# Patient Record
Sex: Male | Born: 1995 | Hispanic: Yes | Smoking: Never smoker
Health system: Southern US, Community
[De-identification: ages and names within clinical notes are randomized; demographics above are authoritative.]

## PROBLEM LIST (undated history)

## (undated) DIAGNOSIS — K7460 Unspecified cirrhosis of liver: Secondary | ICD-10-CM

---
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
## 2014-12-18 DIAGNOSIS — S064XAA Epidural hemorrhage with loss of consciousness status unknown, initial encounter: Secondary | ICD-10-CM | POA: Insufficient documentation

## 2015-12-29 DIAGNOSIS — F1994 Other psychoactive substance use, unspecified with psychoactive substance-induced mood disorder: Secondary | ICD-10-CM | POA: Insufficient documentation

## 2016-05-10 ENCOUNTER — Encounter: Payer: Self-pay | Admitting: *Deleted

## 2016-05-10 ENCOUNTER — Ambulatory Visit
Admission: EM | Admit: 2016-05-10 | Discharge: 2016-05-10 | Disposition: A | Discharge status: Home / Self Care | Payer: Self-pay | Attending: Family Medicine | Admitting: Family Medicine

## 2016-05-10 DIAGNOSIS — L02412 Cutaneous abscess of left axilla: Secondary | ICD-10-CM

## 2016-05-10 MED ORDER — OXYCODONE-ACETAMINOPHEN 5-325 MG PO TABS: 1.0000 | ORAL_TABLET | Freq: Three times a day (TID) | ORAL | 0 refills | Status: DC | PRN

## 2016-05-10 MED ORDER — SULFAMETHOXAZOLE-TRIMETHOPRIM 800-160 MG PO TABS: 1.0000 | ORAL_TABLET | Freq: Two times a day (BID) | ORAL | 0 refills | Status: DC

## 2016-05-10 NOTE — ED Provider Notes (Signed)
[facility]    CSN: [id number] Arrival date & time: 05/10/16  1005     History   Chief Complaint Chief Complaint  Patient presents with  . Abscess    HPI William Beasley is a 20 y.o. male.   Patient reports having pimple underneath his left arm to 3 days maximum he states that started on Tuesday evening. Since then has gotten much bigger and very painful. Past smoking history he denies any medical problems no previous surgeries no known drug allergies. He denies smoking. No pertinent or significant family medical history pertaining to today's visit.    The history is provided by the patient. No language interpreter was used.  Abscess  Location:  Shoulder/arm Shoulder/arm abscess location:  L axilla Abscess quality: fluctuance, painful, redness and warmth   Red streaking: yes   Duration:  4 days Progression:  Worsening Pain details:    Quality:  Pressure and aching   Severity:  Severe   Timing:  Constant   Progression:  Worsening Chronicity:  New Context: not diabetes, not immunosuppression, not injected drug use, not insect bite/sting and not skin injury   Relieved by:  Nothing Ineffective treatments:  None tried Associated symptoms: no fatigue, no fever, no nausea and no vomiting   Risk factors: no family hx of MRSA, no hx of MRSA and no prior abscess     History reviewed. No pertinent past medical history.  There are no active problems to display for this patient.   History reviewed. No pertinent surgical history.     Home Medications    Prior to Admission medications   Medication Sig Start Date End Date Taking? Authorizing Provider  oxyCODONE-acetaminophen (PERCOCET/ROXICET) 5-325 MG tablet Take 1 tablet by mouth every 8 (eight) hours as needed for severe pain. 05/10/16   Hassan Rowan, MD  sulfamethoxazole-trimethoprim (BACTRIM DS,SEPTRA DS) 800-160 MG tablet Take 1 tablet by mouth 2 (two) times daily. 05/10/16   Hassan Rowan, MD    Family  History History reviewed. No pertinent family history.  Social History Social History  Substance Use Topics  . Smoking status: Never Smoker  . Smokeless tobacco: Never Used  . Alcohol use No     Allergies   Review of patient's allergies indicates no known allergies.   Review of Systems Review of Systems  Constitutional: Negative for fatigue and fever.  Gastrointestinal: Negative for nausea and vomiting.  Skin: Positive for rash.  All other systems reviewed and are negative.    Physical Exam Triage Vital Signs ED Triage Vitals  Enc Vitals Group     BP 05/10/16 1039 136/68     Pulse Rate 05/10/16 1039 94     Resp 05/10/16 1039 16     Temp 05/10/16 1039 98.5 F (36.9 C)     Temp Source 05/10/16 1039 Oral     SpO2 05/10/16 1039 100 %     Weight 05/10/16 1040 225 lb (102.1 kg)     Height 05/10/16 1040 5\' 7"  (1.702 m)     Head Circumference --      Peak Flow --      Pain Score 05/10/16 1044 10     Pain Loc --      Pain Edu? --      Excl. in GC? --    No data found.   Updated Vital Signs BP 136/68 (BP Location: Right Arm)   Pulse 94   Temp 98.5 F (36.9 C) (Oral)   Resp 16  Ht 5\' 7"  (1.702 m)   Wt 225 lb (102.1 kg)   SpO2 100%   BMI 35.24 kg/m   Visual Acuity Right Eye Distance:   Left Eye Distance:   Bilateral Distance:    Right Eye Near:   Left Eye Near:    Bilateral Near:     Physical Exam  Constitutional: He is oriented to person, place, and time. He appears well-developed and well-nourished.  HENT:  Head: Normocephalic and atraumatic.  Eyes: Pupils are equal, round, and reactive to light.  Neck: Normal range of motion. Neck supple.  Pulmonary/Chest: Effort normal.  Musculoskeletal: Normal range of motion.  Neurological: He is alert and oriented to person, place, and time.  Skin: There is erythema.     Large abscess underneath the left axillary area skin spread quite a bit with stretching of the skin making stretch marks very prominent  due to the stretching in the dilatation of the skin in the axillary area.  Psychiatric: He has a normal mood and affect.  Vitals reviewed.    UC Treatments / Results  Labs (all labs ordered are listed, but only abnormal results are displayed) Labs Reviewed  AEROBIC CULTURE (SUPERFICIAL SPECIMEN)    EKG  EKG Interpretation None       Radiology No results found.  Procedures .Marland KitchenIncision and Drainage Date/Time: 05/10/2016 11:58 AM Performed by: Hassan Rowan Authorized by: Hassan Rowan   Consent:    Consent obtained:  Verbal   Consent given by:  Patient Location:    Type:  Abscess   Location:  Upper extremity   Upper extremity location:  Shoulder   Shoulder location:  L shoulder Pre-procedure details:    Skin preparation:  Betadine Anesthesia (see MAR for exact dosages):    Anesthesia method:  Local infiltration   Local anesthetic:  Lidocaine 1% w/o epi Procedure type:    Complexity:  Simple Procedure details:    Incision types:  Stab incision and single straight   Incision depth:  Dermal   Scalpel blade:  11   Wound management:  Probed and deloculated and irrigated with saline   Drainage:  Purulent   Drainage amount:  Copious   Wound treatment:  Wound left open   Packing materials:  1/2 in gauze   Amount 1/2":  Enttire pack Post-procedure details:    Patient tolerance of procedure:  Tolerated with difficulty Comments:     Culture obtained packing done irritated about 50 mL's of saline into the abscess wound   (including critical care time)  Medications Ordered in UC Medications - No data to display   Initial Impression / Assessment and Plan / UC Course  I have reviewed the triage vital signs and the nursing notes.  Pertinent labs & imaging results that were available during my care of the patient were reviewed by me and considered in my medical decision making (see chart for details).  Clinical Course    Patient tolerated the opening of the abscess  packing. We'll place on Percocet No. 12 given patient was checked at the [facility] drug reporting site and found to be present on the site over the last year. Place on Septra DS 1 tablet twice a day as well work note for Friday Saturday and Sunday with instructions to return on Sunday for removal of packing and possible repacking that time..  Final Clinical Impressions(s) / UC Diagnoses   Final diagnoses:  Abscess of left axilla    New Prescriptions New Prescriptions  OXYCODONE-ACETAMINOPHEN (PERCOCET/ROXICET) 5-325 MG TABLET    Take 1 tablet by mouth every 8 (eight) hours as needed for severe pain.   SULFAMETHOXAZOLE-TRIMETHOPRIM (BACTRIM DS,SEPTRA DS) 800-160 MG TABLET    Take 1 tablet by mouth 2 (two) times daily.     Hassan RowanEugene Shalen Petrak, MD 05/10/16 1201

## 2016-05-10 NOTE — ED Triage Notes (Signed)
Patient reports a abscess starting 2 days ago in his left armpit. No previous history of cysts or abscess.

## 2016-05-12 ENCOUNTER — Ambulatory Visit
Admission: EM | Admit: 2016-05-12 | Discharge: 2016-05-12 | Disposition: A | Discharge status: Home / Self Care | Payer: Self-pay | Attending: Family Medicine | Admitting: Family Medicine

## 2016-05-12 DIAGNOSIS — L02412 Cutaneous abscess of left axilla: Secondary | ICD-10-CM

## 2016-05-12 LAB — AEROBIC CULTURE W GRAM STAIN (SUPERFICIAL SPECIMEN)

## 2016-05-12 LAB — AEROBIC CULTURE  (SUPERFICIAL SPECIMEN)

## 2016-05-12 MED ORDER — OXYCODONE-ACETAMINOPHEN 5-325 MG PO TABS: 1.0000 | ORAL_TABLET | Freq: Three times a day (TID) | ORAL | 0 refills | Status: DC | PRN

## 2016-05-12 MED ORDER — MUPIROCIN 2 % EX OINT: 1.0000 "application " | TOPICAL_OINTMENT | Freq: Three times a day (TID) | CUTANEOUS | 0 refills | Status: DC

## 2016-05-12 MED ORDER — CLINDAMYCIN HCL 300 MG PO CAPS: 300.0000 mg | ORAL_CAPSULE | Freq: Three times a day (TID) | ORAL | 0 refills | Status: DC

## 2016-05-12 NOTE — ED Triage Notes (Signed)
Follow up for abscess

## 2016-05-12 NOTE — ED Provider Notes (Signed)
[facility]    CSN: [id number] Arrival date & time: 05/12/16  1301     History   Chief Complaint Chief Complaint  Patient presents with  . Abscess    HPI William Beasley is a 20 y.o. male.   Please see previous note. Patient is here for follow-up of abscess. He reports increased pain and increased redness despite having I&D. Abscess did grow out MRSA sensitive to the Septra. He states he's been using 2 Percocet to take care of the pain.   The history is provided by the patient and a significant other. No language interpreter was used.  Abscess  Location:  Shoulder/arm Shoulder/arm abscess location:  L axilla Abscess quality: induration, itching, painful and redness   Abscess quality: no fluctuance   Progression:  Unable to specify Pain details:    Quality:  Throbbing, pressure, sharp and shooting   Severity:  Moderate   Timing:  Constant Chronicity:  New   History reviewed. No pertinent past medical history.  There are no active problems to display for this patient.   History reviewed. No pertinent surgical history.     Home Medications    Prior to Admission medications   Medication Sig Start Date End Date Taking? Authorizing Provider  sulfamethoxazole-trimethoprim (BACTRIM DS,SEPTRA DS) 800-160 MG tablet Take 1 tablet by mouth 2 (two) times daily. 05/10/16  Yes Hassan Rowan, MD  clindamycin (CLEOCIN) 300 MG capsule Take 1 capsule (300 mg total) by mouth 3 (three) times daily. 05/12/16   Hassan Rowan, MD  mupirocin ointment (BACTROBAN) 2 % Apply 1 application topically 3 (three) times daily. 05/12/16   Hassan Rowan, MD  oxyCODONE-acetaminophen (PERCOCET/ROXICET) 5-325 MG tablet Take 1 tablet by mouth every 8 (eight) hours as needed for severe pain. 05/12/16   Hassan Rowan, MD    Family History History reviewed. No pertinent family history.  Social History Social History  Substance Use Topics  . Smoking status: Never Smoker  . Smokeless tobacco: Never  Used  . Alcohol use No     Allergies   Review of patient's allergies indicates no known allergies.   Review of Systems Review of Systems  All other systems reviewed and are negative.    Physical Exam Triage Vital Signs ED Triage Vitals  Enc Vitals Group     BP 05/12/16 1309 (!) 146/75     Pulse Rate 05/12/16 1309 97     Resp 05/12/16 1309 18     Temp 05/12/16 1309 98 F (36.7 C)     Temp Source 05/12/16 1309 Oral     SpO2 05/12/16 1309 97 %     Weight 05/12/16 1308 225 lb (102.1 kg)     Height 05/12/16 1308 5\' 7"  (1.702 m)     Head Circumference --      Peak Flow --      Pain Score 05/12/16 1308 8     Pain Loc --      Pain Edu? --      Excl. in GC? --    No data found.   Updated Vital Signs BP (!) 146/75 (BP Location: Right Arm)   Pulse 97   Temp 98 F (36.7 C) (Oral)   Resp 18   Ht 5\' 7"  (1.702 m)   Wt 225 lb (102.1 kg)   SpO2 97%   BMI 35.24 kg/m   Visual Acuity Right Eye Distance:   Left Eye Distance:   Bilateral Distance:    Right Eye Near:  Left Eye Near:    Bilateral Near:     Physical Exam  Constitutional: He is oriented to person, place, and time. He appears well-developed and well-nourished.  HENT:  Head: Normocephalic.  Neck: Neck supple.  Musculoskeletal: Normal range of motion.  Neurological: He is alert and oriented to person, place, and time. No cranial nerve deficit.  Skin: Rash noted. There is erythema.  Psychiatric: He has a normal mood and affect.  Vitals reviewed.    UC Treatments / Results  Labs (all labs ordered are listed, but only abnormal results are displayed) Labs Reviewed - No data to display  EKG  EKG Interpretation None       Radiology No results found.  Procedures Irrigation Date/Time: 05/12/2016 1:51 PM Performed by: Hassan RowanWADE, Nazia Rhines Authorized by: Hassan RowanWADE, Laranda Burkemper  Consent: Verbal consent obtained. Local anesthesia used: no  Anesthesia: Local anesthesia used: no  Sedation: Patient sedated:  no Comments: Patient had his packing removed because of the abscess going inferior to the open site with ID was over 100 mL's of saline was irrigated into the incision site. Initially of some mild pus was extruded from the site with manipulation and squeezing. Because the amount of pus still present though incision site was also irrigated.    (including critical care time)  Medications Ordered in UC Medications - No data to display   Initial Impression / Assessment and Plan / UC Course  I have reviewed the triage vital signs and the nursing notes.  Pertinent labs & imaging results that were available during my care of the patient were reviewed by me and considered in my medical decision making (see chart for details).  Clinical Course    Explained to patient and his his SO that she really needs to try to have this incision site draining. I recommend reverse Trendelenburg by putting pillows under his hips putting a pad under his left axillary and allowing fluid to drain from the abscess. If he is not better in the next 48 hours if he starts running a fever or the redness gets worse recommend going to the ED of the choice for further evaluation and possible drain to be placed and IV antibiotics . Otherwise we will place him on Cleocin 300 mg 3 times a day with the Septra DS and Bactroban ointment. Will renew his Percocet actually give him more #15 and follow-up on Tuesday.  Final Clinical Impressions(s) / UC Diagnoses   Final diagnoses:  Abscess of left axilla    New Prescriptions New Prescriptions   CLINDAMYCIN (CLEOCIN) 300 MG CAPSULE    Take 1 capsule (300 mg total) by mouth 3 (three) times daily.   MUPIROCIN OINTMENT (BACTROBAN) 2 %    Apply 1 application topically 3 (three) times daily.     Hassan RowanEugene Sherleen Pangborn, MD 05/12/16 [phone]

## 2016-05-14 ENCOUNTER — Ambulatory Visit
Admission: EM | Admit: 2016-05-14 | Discharge: 2016-05-14 | Disposition: A | Discharge status: Home / Self Care | Payer: Self-pay | Attending: Family Medicine | Admitting: Family Medicine

## 2016-05-14 ENCOUNTER — Encounter: Payer: Self-pay | Admitting: *Deleted

## 2016-05-14 DIAGNOSIS — Z5189 Encounter for other specified aftercare: Secondary | ICD-10-CM

## 2016-05-14 MED ORDER — OXYCODONE-ACETAMINOPHEN 7.5-325 MG PO TABS: 1.0000 | ORAL_TABLET | Freq: Three times a day (TID) | ORAL | 0 refills | Status: DC | PRN

## 2016-05-14 NOTE — ED Triage Notes (Signed)
Pt seen here Saturday for left axilla abscess. Here today for wound check. Site remains painful and draining.

## 2016-05-14 NOTE — ED Provider Notes (Signed)
[facility]    CSN: [id number] Arrival date & time: 05/14/16  1106     History   Chief Complaint Chief Complaint  Patient presents with  . Wound Check    HPI William MeigsBrian J Beasley is a 20 y.o. male.   Patient's here for recheck of wound he finally has submitted the wound does feel better. The redness and inflammation has gone down his only concern is that when he takes his pain medicine he still taking 2 Percocet tablets that time was noted to have a higher dosage. States that the 5 mg by themselves not strong enough. He is taking both antibiotics both Septra and clindamycin antibiotic ointment to the area. No known drug allergies no previous surgical history no pertinent medical history no pertinent family medical history and patient does not smoke.   The history is provided by the patient. No language interpreter was used.  Wound Check  This is a new problem. The problem has been gradually improving. Pertinent negatives include no chest pain, no abdominal pain, no headaches and no shortness of breath. The symptoms are aggravated by bending. Relieved by: narcotic  He has tried nothing for the symptoms. The treatment provided no relief.    History reviewed. No pertinent past medical history.  There are no active problems to display for this patient.   History reviewed. No pertinent surgical history.     Home Medications    Prior to Admission medications   Medication Sig Start Date End Date Taking? Authorizing Provider  clindamycin (CLEOCIN) 300 MG capsule Take 1 capsule (300 mg total) by mouth 3 (three) times daily. 05/12/16  Yes Hassan RowanEugene Greenleigh Kauth, MD  sulfamethoxazole-trimethoprim (BACTRIM DS,SEPTRA DS) 800-160 MG tablet Take 1 tablet by mouth 2 (two) times daily. 05/10/16  Yes Hassan RowanEugene Rayleigh Gillyard, MD  mupirocin ointment (BACTROBAN) 2 % Apply 1 application topically 3 (three) times daily. 05/12/16   Hassan RowanEugene Arlind Klingerman, MD  oxyCODONE-acetaminophen (PERCOCET) 7.5-325 MG tablet Take 1 tablet  by mouth every 8 (eight) hours as needed for moderate pain or severe pain. 05/14/16   Hassan RowanEugene Derryl Uher, MD    Family History History reviewed. No pertinent family history.  Social History Social History  Substance Use Topics  . Smoking status: Never Smoker  . Smokeless tobacco: Never Used  . Alcohol use No     Allergies   Review of patient's allergies indicates no known allergies.   Review of Systems Review of Systems  Constitutional: Negative.   HENT: Negative.   Respiratory: Negative.  Negative for shortness of breath.   Cardiovascular: Negative for chest pain.  Gastrointestinal: Negative for abdominal pain.  Skin: Positive for rash and wound.  Neurological: Negative for headaches.  Hematological: Negative for adenopathy. Does not bruise/bleed easily.  All other systems reviewed and are negative.    Physical Exam Triage Vital Signs ED Triage Vitals  Enc Vitals Group     BP 05/14/16 1121 139/66     Pulse Rate 05/14/16 1121 85     Resp 05/14/16 1121 16     Temp 05/14/16 1121 98.1 F (36.7 C)     Temp Source 05/14/16 1121 Oral     SpO2 05/14/16 1121 99 %     Weight 05/14/16 1124 205 lb (93 kg)     Height 05/14/16 1124 5\' 9"  (1.753 m)     Head Circumference --      Peak Flow --      Pain Score --      Pain Loc --  Pain Edu? --      Excl. in GC? --    No data found.   Updated Vital Signs BP 139/66 (BP Location: Left Arm)   Pulse 85   Temp 98.1 F (36.7 C) (Oral)   Resp 16   Ht 5\' 9"  (1.753 m)   Wt 205 lb (93 kg)   SpO2 99%   BMI 30.27 kg/m   Visual Acuity Right Eye Distance:   Left Eye Distance:   Bilateral Distance:    Right Eye Near:   Left Eye Near:    Bilateral Near:     Physical Exam  Constitutional: He is oriented to person, place, and time. He appears well-developed and well-nourished.  HENT:  Head: Normocephalic and atraumatic.  Eyes: Pupils are equal, round, and reactive to light.  Neck: Normal range of motion. Neck supple.    Pulmonary/Chest: Effort normal.  Musculoskeletal: Normal range of motion.  Neurological: He is alert and oriented to person, place, and time.  Skin: Skin is warm. Ecchymosis noted. There is erythema.     The wound on the right axillary area looks markedly improved. Using some gentle pressure some more pus was extruded from the wound site but the wound is open. The marked redness and inflammation he had inferior to the wound is much improved.  Psychiatric: He has a normal mood and affect.  Vitals reviewed.    UC Treatments / Results  Labs (all labs ordered are listed, but only abnormal results are displayed) Labs Reviewed - No data to display  EKG  EKG Interpretation None       Radiology No results found.  Procedures Procedures (including critical care time)  Medications Ordered in UC Medications - No data to display   Initial Impression / Assessment and Plan / UC Course  I have reviewed the triage vital signs and the nursing notes.  Pertinent labs & imaging results that were available during my care of the patient were reviewed by me and considered in my medical decision making (see chart for details).  Clinical Course   The patient has emphasize that is not a fan of procedures with this wound. I reassured him that I'm not going to irrigate the wound or repack it at this time. He may return in a week if needed he is to continue both antibiotics of Septra and clindamycin and Bactroban ointment. I am going to let him return back to work tomorrow we need to keep the area covered. He has informed me that the pain medication is not controlling the pain really takes one he has take 2 tablets 3 wants to know if he had a stronger dosage. This, not clearly sure that he's under need much more pain medication but I will change his Percocet 5 mg to 7 a half give him 12 and every 8 hours but I do not think the patient needs any more medicine for pain at this time.   Final Clinical  Impressions(s) / UC Diagnoses   Final diagnoses:  Wound check, abscess  Abscess re-check    New Prescriptions New Prescriptions   OXYCODONE-ACETAMINOPHEN (PERCOCET) 7.5-325 MG TABLET    Take 1 tablet by mouth every 8 (eight) hours as needed for moderate pain or severe pain.     Hassan Rowan, MD 05/14/16 1254

## 2018-07-04 ENCOUNTER — Other Ambulatory Visit: Payer: Self-pay

## 2018-07-04 ENCOUNTER — Emergency Department
Admission: EM | Admit: 2018-07-04 | Discharge: 2018-07-04 | Disposition: A | Discharge status: Home / Self Care | Payer: Self-pay | Attending: Student in an Organized Health Care Education/Training Program | Admitting: Student in an Organized Health Care Education/Training Program

## 2018-07-04 ENCOUNTER — Encounter: Payer: Self-pay | Admitting: Emergency Medicine

## 2018-07-04 DIAGNOSIS — Y929 Unspecified place or not applicable: Secondary | ICD-10-CM | POA: Insufficient documentation

## 2018-07-04 DIAGNOSIS — R040 Epistaxis: Secondary | ICD-10-CM | POA: Insufficient documentation

## 2018-07-04 DIAGNOSIS — Y9384 Activity, sleeping: Secondary | ICD-10-CM | POA: Insufficient documentation

## 2018-07-04 DIAGNOSIS — K920 Hematemesis: Secondary | ICD-10-CM | POA: Insufficient documentation

## 2018-07-04 DIAGNOSIS — Y998 Other external cause status: Secondary | ICD-10-CM | POA: Insufficient documentation

## 2018-07-04 DIAGNOSIS — K2921 Alcoholic gastritis with bleeding: Secondary | ICD-10-CM | POA: Insufficient documentation

## 2018-07-04 DIAGNOSIS — S0033XA Contusion of nose, initial encounter: Secondary | ICD-10-CM | POA: Insufficient documentation

## 2018-07-04 DIAGNOSIS — W500XXA Accidental hit or strike by another person, initial encounter: Secondary | ICD-10-CM | POA: Insufficient documentation

## 2018-07-04 LAB — CBC
HEMATOCRIT: 47.4 % (ref 39.0–52.0)
Hemoglobin: 16.2 g/dL (ref 13.0–17.0)
MCH: 32.9 pg (ref 26.0–34.0)
MCHC: 34.2 g/dL (ref 30.0–36.0)
MCV: 96.1 fL (ref 80.0–100.0)
PLATELETS: 151 10*3/uL (ref 150–400)
RBC: 4.93 MIL/uL (ref 4.22–5.81)
RDW: 11.9 % (ref 11.5–15.5)
WBC: 5.8 10*3/uL (ref 4.0–10.5)
nRBC: 0 % (ref 0.0–0.2)

## 2018-07-04 LAB — COMPREHENSIVE METABOLIC PANEL
ALT: 60 U/L — ABNORMAL HIGH (ref 0–44)
AST: 69 U/L — ABNORMAL HIGH (ref 15–41)
Albumin: 4.2 g/dL (ref 3.5–5.0)
Alkaline Phosphatase: 71 U/L (ref 38–126)
Anion gap: 9 (ref 5–15)
BUN: 6 mg/dL (ref 6–20)
CHLORIDE: 101 mmol/L (ref 98–111)
CO2: 28 mmol/L (ref 22–32)
Calcium: 9.6 mg/dL (ref 8.9–10.3)
Creatinine, Ser: 0.54 mg/dL — ABNORMAL LOW (ref 0.61–1.24)
Glucose, Bld: 106 mg/dL — ABNORMAL HIGH (ref 70–99)
POTASSIUM: 4.4 mmol/L (ref 3.5–5.1)
SODIUM: 138 mmol/L (ref 135–145)
Total Bilirubin: 1.4 mg/dL — ABNORMAL HIGH (ref 0.3–1.2)
Total Protein: 8.4 g/dL — ABNORMAL HIGH (ref 6.5–8.1)

## 2018-07-04 LAB — TYPE AND SCREEN
ABO/RH(D): A POS
ANTIBODY SCREEN: NEGATIVE

## 2018-07-04 MED ORDER — FAMOTIDINE 20 MG PO TABS: 20.0000 mg | ORAL_TABLET | Freq: Two times a day (BID) | ORAL | 1 refills | Status: DC

## 2018-07-04 MED ORDER — PROCHLORPERAZINE MALEATE 10 MG PO TABS
10.0000 mg | ORAL_TABLET | Freq: Once | ORAL | Status: DC
Start: 2018-07-04 — End: 2018-07-04

## 2018-07-04 MED ORDER — PROCHLORPERAZINE MALEATE 10 MG PO TABS: 10.0000 mg | ORAL_TABLET | Freq: Four times a day (QID) | ORAL | 0 refills | Status: DC | PRN

## 2018-07-04 MED ORDER — SUCRALFATE 1 G PO TABS
1.0000 g | ORAL_TABLET | Freq: Once | ORAL | Status: AC
  Administered 2018-07-04: 1 g via ORAL
  Filled 2018-07-04: qty 1

## 2018-07-04 MED ORDER — FLUTICASONE PROPIONATE 50 MCG/ACT NA SUSP: 1.0000 | Freq: Every day | NASAL | 0 refills | Status: DC

## 2018-07-04 NOTE — ED Notes (Signed)
Pt verbalized understanding of discharge instructions. NAD at this time. 

## 2018-07-04 NOTE — ED Notes (Signed)
MD Robinson at bedside 
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
## 2018-07-04 NOTE — ED Notes (Signed)
Pt refused compazine. Did not want to wait for med to come from pharmacy.

## 2018-07-04 NOTE — ED Triage Notes (Signed)
Pt to ED via POV c/o vomiting blood. Pt states that this started this morning. Pt reports that blood is bright red in color. Pt has had this happen before about 3 months ago and [facility] and told that he had an infection. Pt is unsure exactly how much blood he has vomited but thinks it was about 1/2 cup. Pt is in NAD at this time, color is WNL.

## 2019-07-26 ENCOUNTER — Encounter: Payer: Self-pay | Admitting: Emergency Medicine

## 2019-07-26 ENCOUNTER — Emergency Department
Admission: EM | Admit: 2019-07-26 | Discharge: 2019-07-26 | Disposition: A | Discharge status: Home / Self Care | Payer: Self-pay | Attending: Emergency Medicine | Admitting: Emergency Medicine

## 2019-07-26 ENCOUNTER — Other Ambulatory Visit: Payer: Self-pay

## 2019-07-26 DIAGNOSIS — Z79899 Other long term (current) drug therapy: Secondary | ICD-10-CM | POA: Insufficient documentation

## 2019-07-26 DIAGNOSIS — F1010 Alcohol abuse, uncomplicated: Secondary | ICD-10-CM | POA: Insufficient documentation

## 2019-07-26 DIAGNOSIS — R748 Abnormal levels of other serum enzymes: Secondary | ICD-10-CM

## 2019-07-26 DIAGNOSIS — K625 Hemorrhage of anus and rectum: Secondary | ICD-10-CM | POA: Insufficient documentation

## 2019-07-26 DIAGNOSIS — R945 Abnormal results of liver function studies: Secondary | ICD-10-CM | POA: Insufficient documentation

## 2019-07-26 LAB — COMPREHENSIVE METABOLIC PANEL
ALT: 199 U/L — ABNORMAL HIGH (ref 0–44)
AST: 116 U/L — ABNORMAL HIGH (ref 15–41)
Albumin: 4.8 g/dL (ref 3.5–5.0)
Alkaline Phosphatase: 100 U/L (ref 38–126)
Anion gap: 13 (ref 5–15)
BUN: 6 mg/dL (ref 6–20)
CO2: 23 mmol/L (ref 22–32)
Calcium: 9.5 mg/dL (ref 8.9–10.3)
Chloride: 100 mmol/L (ref 98–111)
Creatinine, Ser: 0.61 mg/dL (ref 0.61–1.24)
GFR calc Af Amer: >60 mL/min (ref >60)
GFR calc non Af Amer: >60 mL/min (ref >60)
Glucose, Bld: 129 mg/dL — ABNORMAL HIGH (ref 70–99)
Potassium: 3.9 mmol/L (ref 3.5–5.1)
Sodium: 136 mmol/L (ref 135–145)
Total Bilirubin: 1 mg/dL (ref 0.3–1.2)
Total Protein: 9.5 g/dL — ABNORMAL HIGH (ref 6.5–8.1)

## 2019-07-26 LAB — CBC
HCT: 49.2 % (ref 39.0–52.0)
Hemoglobin: 16.9 g/dL (ref 13.0–17.0)
MCH: 32.8 pg (ref 26.0–34.0)
MCHC: 34.3 g/dL (ref 30.0–36.0)
MCV: 95.3 fL (ref 80.0–100.0)
Platelets: 208 10*3/uL (ref 150–400)
RBC: 5.16 MIL/uL (ref 4.22–5.81)
RDW: 11.7 % (ref 11.5–15.5)
WBC: 9.4 10*3/uL (ref 4.0–10.5)
nRBC: 0 % (ref 0.0–0.2)

## 2019-07-26 MED ORDER — HYDROCORTISONE ACETATE 25 MG RE SUPP: 25.0000 mg | Freq: Two times a day (BID) | RECTAL | 1 refills | Status: AC

## 2019-07-26 NOTE — ED Triage Notes (Signed)
Patient presents to the ED with bright red bleeding with stools.  Patient states this occurred multiple times today and was a significant amount of blood.  Patient states, "I drink a lot".  Patient states he drinks approx. 1-2 40oz beers per day.  Patient denies any medical history.

## 2019-07-26 NOTE — ED Provider Notes (Signed)
[facility] Emergency Department Provider Note   ____________________________________________   I have reviewed the triage vital signs and the nursing notes.   HISTORY  Chief Complaint Rectal Bleeding   History limited by: Not Limited   HPI William Beasley is a 23 y.o. male who presents to the emergency department today with concerns for rectal bleeding.  The patient states that he noticed some bright red blood per rectum earlier today when he had a bowel movement.  He then had 2 further episodes of rectal bleeding.  He has had this happen once before roughly 6 months ago.  He has not had any rectal pain except for when he wipes.  Patient does state that he drinks a lot of alcohol.  He denies any shortness of breath, dizziness or weakness.   Records reviewed. Per medical record review patient has a history of gastritis.  History reviewed. No pertinent past medical history.  There are no problems to display for this patient.   History reviewed. No pertinent surgical history.  Prior to Admission medications   Medication Sig Start Date End Date Taking? Authorizing Provider  clindamycin (CLEOCIN) 300 MG capsule Take 1 capsule (300 mg total) by mouth 3 (three) times daily. 05/12/16   Hassan Rowan, MD  famotidine (PEPCID) 20 MG tablet Take 1 tablet (20 mg total) by mouth 2 (two) times daily. 07/04/18 07/04/19  Willy Eddy, MD  fluticasone (FLONASE) 50 MCG/ACT nasal spray Place 1 spray into both nostrils daily. 07/04/18 07/04/19  Willy Eddy, MD  mupirocin ointment (BACTROBAN) 2 % Apply 1 application topically 3 (three) times daily. 05/12/16   Hassan Rowan, MD  oxyCODONE-acetaminophen (PERCOCET) 7.5-325 MG tablet Take 1 tablet by mouth every 8 (eight) hours as needed for moderate pain or severe pain. 05/14/16   Hassan Rowan, MD  prochlorperazine (COMPAZINE) 10 MG tablet Take 1 tablet (10 mg total) by mouth every 6 (six) hours as needed for nausea or  vomiting. 07/04/18   Willy Eddy, MD  sulfamethoxazole-trimethoprim (BACTRIM DS,SEPTRA DS) 800-160 MG tablet Take 1 tablet by mouth 2 (two) times daily. 05/10/16   Hassan Rowan, MD    Allergies Patient has no known allergies.  No family history on file.  Social History Social History   Tobacco Use  . Smoking status: Never Smoker  . Smokeless tobacco: Never Used  Substance Use Topics  . Alcohol use: Yes    Comment: 12-18 beer Friday and Saturday   . Drug use: No    Review of Systems Constitutional: No fever/chills Eyes: No visual changes. ENT: No sore throat. Cardiovascular: Denies chest pain. Respiratory: Denies shortness of breath. Gastrointestinal: Positive for rectal bleeding Genitourinary: Negative for dysuria. Musculoskeletal: Negative for back pain. Skin: Negative for rash. Neurological: Negative for headaches, focal weakness or numbness.  ____________________________________________   PHYSICAL EXAM:  VITAL SIGNS: ED Triage Vitals  Enc Vitals Group     BP 07/26/19 1956 (!) 167/100     Pulse Rate 07/26/19 1956 (!) 115     Resp 07/26/19 1956 18     Temp 07/26/19 1956 99.4 F (37.4 C)     Temp Source 07/26/19 1956 Oral     SpO2 07/26/19 1956 97 %     Weight 07/26/19 1957 250 lb (113.4 kg)     Height 07/26/19 1957 5\' 8"  (1.727 m)     Head Circumference --      Peak Flow --      Pain Score 07/26/19 1957 0   Constitutional:  Alert and oriented.  Eyes: Conjunctivae are normal.  ENT      Head: Normocephalic and atraumatic.      Nose: No congestion/rhinnorhea.      Mouth/Throat: Mucous membranes are moist.      Neck: No stridor. Hematological/Lymphatic/Immunilogical: No cervical lymphadenopathy. Cardiovascular: Normal rate, regular rhythm.  No murmurs, rubs, or gallops.  Respiratory: Normal respiratory effort without tachypnea nor retractions. Breath sounds are clear and equal bilaterally. No wheezes/rales/rhonchi. Gastrointestinal: Soft and non  tender. No rebound. No guarding. No external hemorrhoids appreciated. Musculoskeletal: Normal range of motion in all extremities. No lower extremity edema. Neurologic:  Normal speech and language. No gross focal neurologic deficits are appreciated.  Skin:  Skin is warm, dry and intact. No rash noted. Psychiatric: Mood and affect are normal. Speech and behavior are normal. Patient exhibits appropriate insight and judgment.  ____________________________________________    LABS (pertinent positives/negatives)  CBC wbc 9.4, hgb 16.9, plt 208 CMP wnl except glu 129, cr 0.61, ast 116, alt 199  ____________________________________________   EKG  None  ____________________________________________    RADIOLOGY  None  ____________________________________________   PROCEDURES  Procedures  ____________________________________________   INITIAL IMPRESSION / ASSESSMENT AND PLAN / ED COURSE  Pertinent labs & imaging results that were available during my care of the patient were reviewed by me and considered in my medical decision making (see chart for details).   Patient presented to the emergency department today because of concern for rectal bleeding. No obvious external hemorrhoids. Patient declined digital rectal exam. hgb within normal limits. Patient admitted to heavy alcohol use which would explain elevated liver enzymes. Discussed this with the patient, discussed importance of alcohol cessation. Will treat for presumptive hemorrhoids. Discussed gi follow up.  ____________________________________________   FINAL CLINICAL IMPRESSION(S) / ED DIAGNOSES  Final diagnoses:  Rectal bleeding  Elevated liver enzymes  Alcohol abuse     Note: This dictation was prepared with Dragon dictation. Any transcriptional errors that result from this process are unintentional     Nance Pear, MD 07/26/19 [phone]

## 2019-07-26 NOTE — Discharge Instructions (Addendum)
Please seek medical attention for any high fevers, chest pain, shortness of breath, change in behavior, persistent vomiting, bloody stool or any other new or concerning symptoms.  

## 2019-07-26 NOTE — ED Notes (Signed)
EDP at bedside  

## 2021-08-22 DIAGNOSIS — I8511 Secondary esophageal varices with bleeding: Secondary | ICD-10-CM | POA: Insufficient documentation

## 2021-08-22 DIAGNOSIS — K7030 Alcoholic cirrhosis of liver without ascites: Secondary | ICD-10-CM | POA: Insufficient documentation

## 2022-01-18 ENCOUNTER — Other Ambulatory Visit: Payer: Self-pay

## 2022-01-18 ENCOUNTER — Emergency Department: Payer: BLUE CROSS/BLUE SHIELD

## 2022-01-18 ENCOUNTER — Encounter: Payer: Self-pay | Admitting: Intensive Care

## 2022-01-18 ENCOUNTER — Emergency Department
Admission: EM | Admit: 2022-01-18 | Discharge: 2022-01-18 | Disposition: A | Discharge status: Home / Self Care | Payer: BLUE CROSS/BLUE SHIELD | Attending: Emergency Medicine | Admitting: Emergency Medicine

## 2022-01-18 DIAGNOSIS — R112 Nausea with vomiting, unspecified: Secondary | ICD-10-CM | POA: Diagnosis present

## 2022-01-18 DIAGNOSIS — E876 Hypokalemia: Secondary | ICD-10-CM | POA: Diagnosis not present

## 2022-01-18 DIAGNOSIS — R1013 Epigastric pain: Secondary | ICD-10-CM | POA: Insufficient documentation

## 2022-01-18 DIAGNOSIS — K7460 Unspecified cirrhosis of liver: Secondary | ICD-10-CM

## 2022-01-18 DIAGNOSIS — I10 Essential (primary) hypertension: Secondary | ICD-10-CM | POA: Diagnosis not present

## 2022-01-18 DIAGNOSIS — K7030 Alcoholic cirrhosis of liver without ascites: Secondary | ICD-10-CM | POA: Diagnosis not present

## 2022-01-18 HISTORY — DX: Unspecified cirrhosis of liver: K74.60

## 2022-01-18 LAB — CBC
HCT: 45.5 % (ref 39.0–52.0)
Hemoglobin: 15.6 g/dL (ref 13.0–17.0)
MCH: 31.2 pg (ref 26.0–34.0)
MCHC: 34.3 g/dL (ref 30.0–36.0)
MCV: 91 fL (ref 80.0–100.0)
Platelets: 91 10*3/uL — ABNORMAL LOW (ref 150–400)
RBC: 5 MIL/uL (ref 4.22–5.81)
RDW: 13.8 % (ref 11.5–15.5)
WBC: 7.1 10*3/uL (ref 4.0–10.5)
nRBC: 0 % (ref 0.0–0.2)

## 2022-01-18 LAB — COMPREHENSIVE METABOLIC PANEL
ALT: 37 U/L (ref 0–44)
AST: 59 U/L — ABNORMAL HIGH (ref 15–41)
Albumin: 4.1 g/dL (ref 3.5–5.0)
Alkaline Phosphatase: 96 U/L (ref 38–126)
Anion gap: 12 (ref 5–15)
BUN: <5 mg/dL — ABNORMAL LOW (ref 6–20)
CO2: 22 mmol/L (ref 22–32)
Calcium: 9.1 mg/dL (ref 8.9–10.3)
Chloride: 105 mmol/L (ref 98–111)
Creatinine, Ser: 0.51 mg/dL — ABNORMAL LOW (ref 0.61–1.24)
GFR, Estimated: >60 mL/min (ref >60)
Glucose, Bld: 113 mg/dL — ABNORMAL HIGH (ref 70–99)
Potassium: 3.3 mmol/L — ABNORMAL LOW (ref 3.5–5.1)
Sodium: 139 mmol/L (ref 135–145)
Total Bilirubin: 1.3 mg/dL — ABNORMAL HIGH (ref 0.3–1.2)
Total Protein: 8.7 g/dL — ABNORMAL HIGH (ref 6.5–8.1)

## 2022-01-18 LAB — PROTIME-INR
INR: 1.3 — ABNORMAL HIGH (ref 0.8–1.2)
Prothrombin Time: 15.8 seconds — ABNORMAL HIGH (ref 11.4–15.2)

## 2022-01-18 LAB — LIPASE, BLOOD: Lipase: 40 U/L (ref 11–51)

## 2022-01-18 LAB — MAGNESIUM: Magnesium: 1.6 mg/dL — ABNORMAL LOW (ref 1.7–2.4)

## 2022-01-18 MED ORDER — PANTOPRAZOLE INFUSION (NEW) - SIMPLE MED
8.0000 mg/h | INTRAVENOUS | Status: DC
  Administered 2022-01-18: 8 mg/h via INTRAVENOUS
  Filled 2022-01-18: qty 100

## 2022-01-18 MED ORDER — IOHEXOL 300 MG/ML  SOLN
100.0000 mL | Freq: Once | INTRAMUSCULAR | Status: AC | PRN
  Administered 2022-01-18: 100 mL via INTRAVENOUS

## 2022-01-18 MED ORDER — FENTANYL CITRATE PF 50 MCG/ML IJ SOSY
50.0000 ug | PREFILLED_SYRINGE | Freq: Once | INTRAMUSCULAR | Status: AC
  Administered 2022-01-18: 50 ug via INTRAVENOUS
  Filled 2022-01-18: qty 1

## 2022-01-18 MED ORDER — ONDANSETRON 4 MG PO TBDP: 4.0000 mg | ORAL_TABLET | Freq: Three times a day (TID) | ORAL | 0 refills | Status: AC | PRN

## 2022-01-18 MED ORDER — ONDANSETRON HCL 4 MG/2ML IJ SOLN
4.0000 mg | Freq: Once | INTRAMUSCULAR | Status: AC | PRN
  Administered 2022-01-18: 4 mg via INTRAVENOUS
  Filled 2022-01-18: qty 2

## 2022-01-18 MED ORDER — POTASSIUM CHLORIDE CRYS ER 20 MEQ PO TBCR
40.0000 meq | EXTENDED_RELEASE_TABLET | Freq: Once | ORAL | Status: AC
  Administered 2022-01-18: 40 meq via ORAL
  Filled 2022-01-18: qty 2

## 2022-01-18 MED ORDER — DIPHENHYDRAMINE HCL 50 MG/ML IJ SOLN
50.0000 mg | Freq: Once | INTRAMUSCULAR | Status: AC
  Administered 2022-01-18: 50 mg via INTRAVENOUS
  Filled 2022-01-18: qty 1

## 2022-01-18 MED ORDER — SODIUM CHLORIDE 0.9 % IV SOLN
1.0000 g | Freq: Once | INTRAVENOUS | Status: AC
  Administered 2022-01-18: 1 g via INTRAVENOUS
  Filled 2022-01-18: qty 10

## 2022-01-18 MED ORDER — PROCHLORPERAZINE EDISYLATE 10 MG/2ML IJ SOLN
10.0000 mg | Freq: Once | INTRAMUSCULAR | Status: AC
  Administered 2022-01-18: 10 mg via INTRAVENOUS
  Filled 2022-01-18: qty 2

## 2022-01-18 MED ORDER — POTASSIUM CHLORIDE 10 MEQ/100ML IV SOLN
10.0000 meq | Freq: Once | INTRAVENOUS | Status: AC
  Administered 2022-01-18: 10 meq via INTRAVENOUS
  Filled 2022-01-18: qty 100

## 2022-01-18 MED ORDER — ONDANSETRON HCL 4 MG/2ML IJ SOLN
4.0000 mg | Freq: Once | INTRAMUSCULAR | Status: AC
Start: 2022-01-18 — End: 2022-01-18
  Administered 2022-01-18: 4 mg via INTRAVENOUS
  Filled 2022-01-18: qty 2

## 2022-01-18 MED ORDER — PANTOPRAZOLE 80MG IVPB - SIMPLE MED
80.0000 mg | Freq: Once | INTRAVENOUS | Status: AC
  Administered 2022-01-18: 80 mg via INTRAVENOUS
  Filled 2022-01-18: qty 100

## 2022-01-18 MED ORDER — MAGNESIUM SULFATE 2 GM/50ML IV SOLN
2.0000 g | Freq: Once | INTRAVENOUS | Status: AC
  Administered 2022-01-18: 2 g via INTRAVENOUS
  Filled 2022-01-18: qty 50

## 2022-01-18 MED ORDER — MAGNESIUM OXIDE -MG SUPPLEMENT 400 (240 MG) MG PO TABS: 800.0000 mg | ORAL_TABLET | ORAL | Status: DC

## 2022-01-18 MED ORDER — PANTOPRAZOLE SODIUM 40 MG PO TBEC: 40.0000 mg | DELAYED_RELEASE_TABLET | Freq: Every day | ORAL | 0 refills | Status: DC

## 2022-01-18 MED ORDER — DROPERIDOL 2.5 MG/ML IJ SOLN
1.2500 mg | Freq: Once | INTRAMUSCULAR | Status: AC
  Administered 2022-01-18: 1.25 mg via INTRAVENOUS
  Filled 2022-01-18: qty 2

## 2022-01-18 MED ORDER — PANTOPRAZOLE SODIUM 40 MG IV SOLR: 40.0000 mg | Freq: Two times a day (BID) | INTRAVENOUS | Status: DC

## 2022-01-18 NOTE — ED Provider Notes (Signed)
[facility] Provider Note    Event Date/Time   First MD Initiated Contact with Patient 01/18/22 1307     (approximate)   History   Abdominal Pain and Emesis   HPI  William Beasley is a 26 y.o. 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  He denies any melanotic stools or diarrhea or constipation.  No back pain, chest pain, cough, shortness of breath, headache area, sore throat rash or extremity pain.  He states that last night he had 6 nonalcoholic [employer]'s but otherwise does not drink any alcohol denies any illicit drug use or NSAID use.      Physical Exam  Triage Vital Signs: ED Triage Vitals [01/18/22 1255]  Enc Vitals Group     BP      Pulse      Resp      Temp      Temp src      SpO2      Weight 240 lb (108.9 kg)     Height _0  (1.753 m)     Head Circumference      Peak Flow      Pain Score 10     Pain Loc      Pain Edu?      Excl. in [place]?     Most recent vital signs: Vitals:   01/18/22 1318  BP: 132/78  Pulse: 88  Resp: 20  Temp: 98 F (36.7 C)  SpO2: 99%    General: Awake, very uncomfortable appearing. CV:  Good peripheral perfusion.  2+ radial pulses. Resp:  Normal effort.  Clear bilaterally. Abd:  Distended and tender in epigastrium and right upper quadrant.  No CVA tenderness. Other:     ED Results / Procedures / Treatments  Labs (all labs ordered are listed, but only abnormal results are displayed) Labs Reviewed  COMPREHENSIVE METABOLIC PANEL - Abnormal; Notable for the following components:      Result Value   Potassium 3.3 (*)    Glucose, Bld 113 (*)    BUN <5 (*)     Creatinine, Ser 0.51 (*)    Total Protein 8.7 (*)    AST 59 (*)    Total Bilirubin 1.3 (*)    All other components within normal limits  CBC - Abnormal; Notable for the following components:   Platelets 91 (*)    All other components within normal limits  MAGNESIUM - Abnormal; Notable for the following components:   Magnesium 1.6 (*)    All other components within normal limits  PROTIME-INR - Abnormal; Notable for the following components:   Prothrombin Time 15.8 (*)    INR 1.3 (*)    All other components within normal limits  LIPASE, BLOOD  URINALYSIS, ROUTINE W REFLEX MICROSCOPIC  TYPE AND SCREEN     EKG    RADIOLOGY  Right upper quadrant ultrasound my interpretation shows what appears to be some steatosis although I am unable to clearly visualize the gallbladder.  I did also review radiology's interpretation.  CT abdomen pelvis on my interpretation does show what appears to be cirrhosis without evidence of cholecystitis, dilated common  bile duct, pancreatitis, contained perforation, kidney stone or any other acute abdominal or pelvic process.  I also reviewed radiology interpretation and agree to findings of cirrhosis with evidence of portal hypertension and several subcentimeter hypodensities with recommendation for MRI liver protocol follow-up as well as hepatosplenomegaly and some increased echogenicity in the right hilum with recommendation to follow this up with a CT.   PROCEDURES:  Critical Care performed: No  .1-3 Lead EKG Interpretation  Performed by: Lucrezia Starch, MD Authorized by: Lucrezia Starch, MD     Interpretation: normal     ECG rate assessment: normal     Rhythm: sinus rhythm     Ectopy: none     Conduction: normal     The patient is on the cardiac monitor to evaluate for evidence of arrhythmia and/or significant heart rate changes.   MEDICATIONS ORDERED IN ED: Medications  pantoprozole (PROTONIX) 80 mg /NS 100 mL infusion (8 mg/hr  Intravenous New Bag/Given 01/18/22 1508)  pantoprazole (PROTONIX) injection 40 mg (has no administration in time range)  ondansetron (ZOFRAN) injection 4 mg (4 mg Intravenous Given 01/18/22 1300)  fentaNYL (SUBLIMAZE) injection 50 mcg (50 mcg Intravenous Given 01/18/22 1338)  pantoprazole (PROTONIX) 80 mg /NS 100 mL IVPB (0 mg Intravenous Stopped 01/18/22 1440)  cefTRIAXone (ROCEPHIN) 1 g in sodium chloride 0.9 % 100 mL IVPB (0 g Intravenous Stopped 01/18/22 1435)  magnesium sulfate IVPB 2 g 50 mL (2 g Intravenous New Bag/Given 01/18/22 1514)  potassium chloride 10 mEq in 100 mL IVPB (0 mEq Intravenous Stopped 01/18/22 1506)  ondansetron (ZOFRAN) injection 4 mg (4 mg Intravenous Given 01/18/22 1456)  prochlorperazine (COMPAZINE) injection 10 mg (10 mg Intravenous Given 01/18/22 1542)  fentaNYL (SUBLIMAZE) injection 50 mcg (50 mcg Intravenous Given 01/18/22 1543)  iohexol (OMNIPAQUE) 300 MG/ML solution 100 mL (100 mLs Intravenous Contrast Given 01/18/22 1524)     IMPRESSION / MDM / ASSESSMENT AND PLAN / ED COURSE  I reviewed the triage vital signs and the nursing notes. Patient's presentation is most consistent with acute presentation with potential threat to life or bodily function.                               Differential diagnosis includes, but is not limited to cholecystitis, exacerbation of underlying hepatitis, gastritis, peptic ulcer disease, pancreatitis and possible esophageal bleed and Mallory-Weiss tear from vomiting.  Lipase WNL and not suggestive of pancreatitis.  CMP is remarkable for K3.3, AST of 59 and T. bili of 1.3.  Alk phos and ALT are WNL.  CBC without leukocytosis or acute anemia.  Magnesium 1.6.  INR 1.3.  Right upper quadrant ultrasound my interpretation shows what appears to be some steatosis although I am unable to clearly visualize the gallbladder.  I did also review radiology's interpretation.  CT abdomen pelvis on my interpretation does show what appears to be cirrhosis  without evidence of cholecystitis, dilated common bile duct, pancreatitis, contained perforation, kidney stone or any other acute abdominal or pelvic process.  I also reviewed radiology interpretation and agree to findings of cirrhosis with evidence of portal hypertension and several subcentimeter hypodensities with recommendation for MRI liver protocol follow-up as well as hepatosplenomegaly and some increased echogenicity in the right hilum with recommendation to follow this up with a CT.  Patient's history and exam and work-up is not suggestive of perforation, cholecystitis, pancreatitis and he has not had any vomiting after 3 and half  hours in the emergency room.  After Protonix Bentyl and Zofran Compazine he is feeling drastically much improved.  He is able to tolerate ginger ale without any difficulty and states he feels much better and wants to go home.  Discussed importance of close outpatient follow-up with his GI doctor as well as his PCP to have his electrolytes rechecked.  We will start him on Protonix and write Rx for Zofran.  Discussed incidental findings on imaging as noted above and recommendation for outpatient chest CT as well as liver MRI protocol to be coordinated by his GI doctor and PCP as indicated.  Discussed returning for any new or worsening of symptoms.  Discharged in stable condition.  Strict return precautions advised and discussed.      FINAL CLINICAL IMPRESSION(S) / ED DIAGNOSES   Final diagnoses:  Nausea and vomiting, unspecified vomiting type  Epigastric pain  Hypomagnesemia  Hypokalemia  Cirrhosis of liver without ascites, unspecified hepatic cirrhosis type ([place])     Rx / DC Orders   ED Discharge Orders          Ordered    pantoprazole (PROTONIX) 40 MG tablet  Daily        01/18/22 1623    ondansetron (ZOFRAN-ODT) 4 MG disintegrating tablet  Every 8 hours PRN        01/18/22 1623             Note:  This document was prepared using Dragon voice  recognition software and may include unintentional dictation errors.   Lucrezia Starch, MD 01/18/22 1630

## 2022-01-18 NOTE — Discharge Instructions (Addendum)
As we discussed please follow-up with your GI doctor and your primary care doctor for further imaging including CT of the chest to follow-up the enlarged lymph nodes and MRI of the abdomen to follow-up some abnormal findings in your liver.  CT A/P  IMPRESSION: 1. Cirrhosis with portal hypertension. Underlying innumerable hepatic subcentimeter hypodensities. Recommend MRI liver protocol for further evaluation. 2. Hepatosplenomegaly. 3. Query partially visualized right hilar soft tissue density. Consider CT chest with intravenous contrast for further evaluation.

## 2022-01-18 NOTE — ED Triage Notes (Signed)
Patient c/o abdominal pain with N/V since this AM. Reports hx cirrhosis of liver. Patient reports drinking 6 non alcoholic beers last night

## 2022-01-22 ENCOUNTER — Ambulatory Visit
Admission: EM | Admit: 2022-01-22 | Discharge: 2022-01-22 | Disposition: A | Discharge status: Home / Self Care | Payer: BLUE CROSS/BLUE SHIELD

## 2022-01-22 DIAGNOSIS — K7460 Unspecified cirrhosis of liver: Secondary | ICD-10-CM | POA: Diagnosis not present

## 2022-01-22 MED ORDER — THIAMINE MONONITRATE 100 MG PO TABS: 100.0000 mg | ORAL_TABLET | Freq: Every day | ORAL | 1 refills | Status: DC

## 2022-01-22 MED ORDER — FOLIC ACID 1 MG PO TABS: 1.0000 mg | ORAL_TABLET | Freq: Every day | ORAL | 1 refills | Status: DC

## 2022-01-22 MED ORDER — CARVEDILOL 6.25 MG PO TABS: 6.2500 mg | ORAL_TABLET | Freq: Two times a day (BID) | ORAL | 1 refills | Status: DC

## 2022-01-22 MED ORDER — TRAMADOL HCL 50 MG PO TABS: 50.0000 mg | ORAL_TABLET | Freq: Four times a day (QID) | ORAL | 0 refills | Status: DC | PRN

## 2022-01-22 NOTE — ED Provider Notes (Signed)
[facility]    CSN: [id number] Arrival date & time: 01/22/22  1433      History   Chief Complaint Chief Complaint  Patient presents with   Medication Refill    HPI William Beasley is a 26 y.o. male.   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  Patient being followed by liver program at [facility] and is scheduled for a biopsy on the 28th.  Patient presenting needing refills of his thiamine, Coreg and also requesting medication for pain.  Patient states that the GI doctor at [facility] is want him to follow-up with primary care but his closest primary care availability is in September.    Past Medical History:  Diagnosis Date   Cirrhosis of liver (HCC)     There are no problems to display for this patient.   History reviewed. No pertinent surgical history.     Home Medications    Prior to Admission medications   Medication Sig Start Date End Date Taking? Authorizing Provider  Multiple Vitamins-Minerals (THERA-M) TABS Take 1 tablet by mouth daily. 08/22/21  Yes [provider]  traMADol (ULTRAM) 50 MG tablet Take 1 tablet (50 mg total) by mouth every 6 (six) hours as needed. 01/22/22  Yes Candis Schatz, PA-C  carvedilol (COREG) 6.25 MG tablet Take 1 tablet (6.25 mg total) by mouth 2 (two) times daily. 01/22/22   Candis Schatz, PA-C  fluticasone (FLONASE) 50 MCG/ACT nasal spray Place 1 spray into both nostrils daily. 07/04/18 07/04/19  Willy Eddy, MD  folic acid (FOLVITE) 1 MG tablet Take 1 tablet (1 mg total) by mouth daily. 01/22/22   Candis Schatz, PA-C  mupirocin ointment (BACTROBAN) 2 % Apply 1 application topically 3 (three) times daily. 05/12/16   Hassan Rowan, MD  ondansetron (ZOFRAN-ODT) 4 MG disintegrating tablet Take 1 tablet (4 mg total) by mouth every 8 (eight) hours as needed for up to 5 days for nausea or vomiting. 01/18/22 01/23/22  Gilles Chiquito, MD   pantoprazole (PROTONIX) 40 MG tablet Take 1 tablet (40 mg total) by mouth daily. 01/18/22 02/17/22  Gilles Chiquito, MD  thiamine (VITAMIN B-1) 100 MG tablet Take 1 tablet (100 mg total) by mouth daily. 01/22/22   Candis Schatz, PA-C    Family History History reviewed. No pertinent family history.  Social History Social History   Tobacco Use   Smoking status: Never   Smokeless tobacco: Never  Vaping Use   Vaping Use: Never used  Substance Use Topics   Alcohol use: Not Currently    Comment: 12-18 beer Friday and Saturday    Drug use: No     Allergies   Patient has no known allergies.   Review of Systems Review of Systems as noted above in HPI.  Other systems reviewed and found to be negative.   Physical Exam Triage Vital Signs ED Triage Vitals  Enc Vitals Group     BP 01/22/22 1558 (S) (!) 146/112     Pulse Rate 01/22/22 1558 80     Resp 01/22/22 1558 16     Temp 01/22/22 1558 98.1 F (36.7 C)     Temp Source 01/22/22 1558 Oral     SpO2 01/22/22 1558 99 %     Weight --      Height --      Head Circumference --      Peak Flow --  Pain Score 01/22/22 1554 7     Pain Loc --      Pain Edu? --      Excl. in GC? --    No data found.  Updated Vital Signs BP (S) (!) 146/112 (BP Location: Left Arm) Comment: not on his meds.  Pulse 80   Temp 98.1 F (36.7 C) (Oral)   Resp 16   SpO2 99%    Physical Exam Constitutional:      Appearance: Normal appearance.  Pulmonary:     Effort: Pulmonary effort is normal. No respiratory distress.     Breath sounds: No wheezing.  Abdominal:     General: Abdomen is flat.     Palpations: Abdomen is soft.     Tenderness: There is abdominal tenderness (RUQ). There is no rebound.  Skin:    General: Skin is warm and dry.  Neurological:     General: No focal deficit present.     Mental Status: He is alert and oriented to person, place, and time.      UC Treatments / Results  Labs (all labs ordered are listed, but  only abnormal results are displayed) Labs Reviewed - No data to display  EKG   Radiology No results found.  Procedures Procedures (including critical care time)  Medications Ordered in UC Medications - No data to display  Initial Impression / Assessment and Plan / UC Course  I have reviewed the triage vital signs and the nursing notes.  Pertinent labs & imaging results that were available during my care of the patient were reviewed by me and considered in my medical decision making (see chart for details).    Patient with history of cirrhosis and imaging showing advancement to include portal hypertension and esophageal varices.  Patient with biopsy plan for 628.  Followed by [facility] hepatology  Final Clinical Impressions(s) / UC Diagnoses   Final diagnoses:  Hepatic cirrhosis, unspecified hepatic cirrhosis type, unspecified whether ascites present [facility])     Discharge Instructions      -Continue Coreg, folic acid, and thiamine -Tramadol as needed for pain -Keep June 28 biopsy appointment.  Can request again with Dr. Clelia Croft for him to continue these meds since you are able to see a primary provider or see if his office can contact Dr. Theresia Majors office for an earlier appointment given your complicated medical status and need for ongoing management for both liver and lung disease.     ED Prescriptions     Medication Sig Dispense Auth. Provider   folic acid (FOLVITE) 1 MG tablet Take 1 tablet (1 mg total) by mouth daily. 30 tablet Candis Schatz, PA-C   carvedilol (COREG) 6.25 MG tablet Take 1 tablet (6.25 mg total) by mouth 2 (two) times daily. 60 tablet Candis Schatz, PA-C   thiamine (VITAMIN B-1) 100 MG tablet Take 1 tablet (100 mg total) by mouth daily. 30 tablet Candis Schatz, PA-C   traMADol (ULTRAM) 50 MG tablet Take 1 tablet (50 mg total) by mouth every 6 (six) hours as needed. 15 tablet Candis Schatz, PA-C      I have reviewed the PDMP during this  encounter.     Candis Schatz, PA-C 01/22/22 1620

## 2022-01-22 NOTE — ED Triage Notes (Signed)
Patient presents to Urgent Care for medication refill. He states he was seen at [facility] ED 02/10 and dx with cirrhosis. He was prescribed carvedilol and thiamine as part of his treatment plan and instructed to follow-up with PCP. He states he never followed up and out of his meds.

## 2022-01-22 NOTE — Discharge Instructions (Addendum)
-  Continue Coreg, folic acid, and thiamine -Tramadol as needed for pain -Keep June 28 biopsy appointment.  Can request again with Dr. Clelia Croft for him to continue these meds since you are able to see a primary provider or see if his office can contact Dr. Theresia Majors office for an earlier appointment given your complicated medical status and need for ongoing management for both liver and lung disease.

## 2022-03-12 ENCOUNTER — Inpatient Hospital Stay: Payer: BLUE CROSS/BLUE SHIELD

## 2022-03-12 ENCOUNTER — Other Ambulatory Visit: Payer: Self-pay

## 2022-03-12 ENCOUNTER — Inpatient Hospital Stay
Admission: EM | Admit: 2022-03-12 | Discharge: 2022-03-14 | DRG: 378 | Disposition: A | Discharge status: Home / Self Care | Payer: BLUE CROSS/BLUE SHIELD | Attending: Internal Medicine | Admitting: Internal Medicine

## 2022-03-12 ENCOUNTER — Emergency Department: Payer: BLUE CROSS/BLUE SHIELD

## 2022-03-12 DIAGNOSIS — D860 Sarcoidosis of lung: Secondary | ICD-10-CM | POA: Diagnosis present

## 2022-03-12 DIAGNOSIS — K922 Gastrointestinal hemorrhage, unspecified: Principal | ICD-10-CM | POA: Diagnosis present

## 2022-03-12 DIAGNOSIS — K7030 Alcoholic cirrhosis of liver without ascites: Secondary | ICD-10-CM | POA: Diagnosis present

## 2022-03-12 DIAGNOSIS — F1010 Alcohol abuse, uncomplicated: Secondary | ICD-10-CM | POA: Diagnosis present

## 2022-03-12 DIAGNOSIS — T405X1A Poisoning by cocaine, accidental (unintentional), initial encounter: Secondary | ICD-10-CM | POA: Diagnosis present

## 2022-03-12 DIAGNOSIS — X58XXXA Exposure to other specified factors, initial encounter: Secondary | ICD-10-CM | POA: Diagnosis present

## 2022-03-12 DIAGNOSIS — D8689 Sarcoidosis of other sites: Secondary | ICD-10-CM | POA: Diagnosis present

## 2022-03-12 DIAGNOSIS — K7460 Unspecified cirrhosis of liver: Secondary | ICD-10-CM

## 2022-03-12 DIAGNOSIS — F1290 Cannabis use, unspecified, uncomplicated: Secondary | ICD-10-CM | POA: Diagnosis present

## 2022-03-12 DIAGNOSIS — Z79899 Other long term (current) drug therapy: Secondary | ICD-10-CM | POA: Diagnosis not present

## 2022-03-12 DIAGNOSIS — K2921 Alcoholic gastritis with bleeding: Principal | ICD-10-CM | POA: Diagnosis present

## 2022-03-12 DIAGNOSIS — Z87820 Personal history of traumatic brain injury: Secondary | ICD-10-CM | POA: Diagnosis not present

## 2022-03-12 DIAGNOSIS — D6959 Other secondary thrombocytopenia: Secondary | ICD-10-CM | POA: Diagnosis present

## 2022-03-12 DIAGNOSIS — K920 Hematemesis: Secondary | ICD-10-CM | POA: Diagnosis not present

## 2022-03-12 DIAGNOSIS — I8510 Secondary esophageal varices without bleeding: Secondary | ICD-10-CM | POA: Diagnosis present

## 2022-03-12 DIAGNOSIS — K760 Fatty (change of) liver, not elsewhere classified: Secondary | ICD-10-CM | POA: Diagnosis present

## 2022-03-12 DIAGNOSIS — K766 Portal hypertension: Secondary | ICD-10-CM | POA: Diagnosis present

## 2022-03-12 DIAGNOSIS — T405X1D Poisoning by cocaine, accidental (unintentional), subsequent encounter: Secondary | ICD-10-CM | POA: Diagnosis not present

## 2022-03-12 DIAGNOSIS — F1490 Cocaine use, unspecified, uncomplicated: Secondary | ICD-10-CM | POA: Diagnosis present

## 2022-03-12 LAB — URINE DRUG SCREEN, QUALITATIVE (ARMC ONLY)
Amphetamines, Ur Screen: NOT DETECTED
Barbiturates, Ur Screen: NOT DETECTED
Benzodiazepine, Ur Scrn: NOT DETECTED
Cannabinoid 50 Ng, Ur ~~LOC~~: POSITIVE — AB
Cocaine Metabolite,Ur ~~LOC~~: POSITIVE — AB
MDMA (Ecstasy)Ur Screen: NOT DETECTED
Methadone Scn, Ur: NOT DETECTED
Opiate, Ur Screen: NOT DETECTED
Phencyclidine (PCP) Ur S: NOT DETECTED
Tricyclic, Ur Screen: NOT DETECTED

## 2022-03-12 LAB — URINALYSIS, ROUTINE W REFLEX MICROSCOPIC
Bilirubin Urine: NEGATIVE
Glucose, UA: NEGATIVE mg/dL
Hgb urine dipstick: NEGATIVE
Ketones, ur: NEGATIVE mg/dL
Nitrite: NEGATIVE
Protein, ur: 100 mg/dL — AB
Specific Gravity, Urine: 1.021 (ref 1.005–1.030)
pH: 8 (ref 5.0–8.0)

## 2022-03-12 LAB — COMPREHENSIVE METABOLIC PANEL
ALT: 43 U/L (ref 0–44)
AST: 74 U/L — ABNORMAL HIGH (ref 15–41)
Albumin: 4.2 g/dL (ref 3.5–5.0)
Alkaline Phosphatase: 118 U/L (ref 38–126)
Anion gap: 12 (ref 5–15)
BUN: <5 mg/dL — ABNORMAL LOW (ref 6–20)
CO2: 22 mmol/L (ref 22–32)
Calcium: 9.2 mg/dL (ref 8.9–10.3)
Chloride: 101 mmol/L (ref 98–111)
Creatinine, Ser: 0.57 mg/dL — ABNORMAL LOW (ref 0.61–1.24)
GFR, Estimated: >60 mL/min (ref >60)
Glucose, Bld: 140 mg/dL — ABNORMAL HIGH (ref 70–99)
Potassium: 3.5 mmol/L (ref 3.5–5.1)
Sodium: 135 mmol/L (ref 135–145)
Total Bilirubin: 1.7 mg/dL — ABNORMAL HIGH (ref 0.3–1.2)
Total Protein: 8.7 g/dL — ABNORMAL HIGH (ref 6.5–8.1)

## 2022-03-12 LAB — CBC
HCT: 45.8 % (ref 39.0–52.0)
Hemoglobin: 15.6 g/dL (ref 13.0–17.0)
MCH: 32 pg (ref 26.0–34.0)
MCHC: 34.1 g/dL (ref 30.0–36.0)
MCV: 94 fL (ref 80.0–100.0)
Platelets: 71 10*3/uL — ABNORMAL LOW (ref 150–400)
RBC: 4.87 MIL/uL (ref 4.22–5.81)
RDW: 14.6 % (ref 11.5–15.5)
WBC: 4.3 10*3/uL (ref 4.0–10.5)
nRBC: 0 % (ref 0.0–0.2)

## 2022-03-12 LAB — MRSA NEXT GEN BY PCR, NASAL: MRSA by PCR Next Gen: NOT DETECTED

## 2022-03-12 LAB — TYPE AND SCREEN
ABO/RH(D): A POS
Antibody Screen: NEGATIVE

## 2022-03-12 LAB — GLUCOSE, CAPILLARY: Glucose-Capillary: 161 mg/dL — ABNORMAL HIGH (ref 70–99)

## 2022-03-12 LAB — PROTIME-INR
INR: 1.3 — ABNORMAL HIGH (ref 0.8–1.2)
Prothrombin Time: 15.9 seconds — ABNORMAL HIGH (ref 11.4–15.2)

## 2022-03-12 LAB — LIPASE, BLOOD: Lipase: 39 U/L (ref 11–51)

## 2022-03-12 LAB — APTT: aPTT: 33 seconds (ref 24–36)

## 2022-03-12 LAB — FIBRINOGEN: Fibrinogen: 199 mg/dL — ABNORMAL LOW (ref 210–475)

## 2022-03-12 MED ORDER — PROCHLORPERAZINE EDISYLATE 10 MG/2ML IJ SOLN
10.0000 mg | Freq: Once | INTRAMUSCULAR | Status: AC
  Administered 2022-03-12: 10 mg via INTRAVENOUS
  Filled 2022-03-12: qty 2

## 2022-03-12 MED ORDER — DOCUSATE SODIUM 100 MG PO CAPS: 100.0000 mg | ORAL_CAPSULE | Freq: Two times a day (BID) | ORAL | Status: DC | PRN

## 2022-03-12 MED ORDER — ONDANSETRON 4 MG PO TBDP
4.0000 mg | ORAL_TABLET | Freq: Once | ORAL | Status: AC
  Administered 2022-03-12: 4 mg via ORAL
  Filled 2022-03-12: qty 1

## 2022-03-12 MED ORDER — LORAZEPAM 2 MG/ML IJ SOLN
1.0000 mg | INTRAMUSCULAR | Status: DC | PRN
  Administered 2022-03-12 – 2022-03-13 (×3): 2 mg via INTRAVENOUS
  Filled 2022-03-12 (×3): qty 1

## 2022-03-12 MED ORDER — SODIUM CHLORIDE 0.9 % IV SOLN: 25.0000 mg | Freq: Four times a day (QID) | INTRAVENOUS | Status: DC | PRN

## 2022-03-12 MED ORDER — IOHEXOL 350 MG/ML SOLN
100.0000 mL | Freq: Once | INTRAVENOUS | Status: AC | PRN
  Administered 2022-03-12: 100 mL via INTRAVENOUS

## 2022-03-12 MED ORDER — PANTOPRAZOLE SODIUM 40 MG IV SOLR: 40.0000 mg | Freq: Two times a day (BID) | INTRAVENOUS | Status: DC

## 2022-03-12 MED ORDER — HYDROMORPHONE HCL 1 MG/ML IJ SOLN
1.0000 mg | Freq: Once | INTRAMUSCULAR | Status: AC
  Administered 2022-03-12: 1 mg via INTRAVENOUS
  Filled 2022-03-12: qty 1

## 2022-03-12 MED ORDER — OCTREOTIDE LOAD VIA INFUSION
50.0000 ug | Freq: Once | INTRAVENOUS | Status: AC
  Administered 2022-03-12: 50 ug via INTRAVENOUS
  Filled 2022-03-12: qty 25

## 2022-03-12 MED ORDER — THIAMINE HCL 100 MG/ML IJ SOLN
100.0000 mg | Freq: Every day | INTRAMUSCULAR | Status: DC
  Administered 2022-03-12 – 2022-03-14 (×3): 100 mg via INTRAVENOUS
  Filled 2022-03-12 (×3): qty 2

## 2022-03-12 MED ORDER — LORAZEPAM 1 MG PO TABS: 1.0000 mg | ORAL_TABLET | ORAL | Status: DC | PRN

## 2022-03-12 MED ORDER — ONDANSETRON HCL 4 MG/2ML IJ SOLN
4.0000 mg | Freq: Four times a day (QID) | INTRAMUSCULAR | Status: DC | PRN
  Administered 2022-03-12 – 2022-03-13 (×2): 4 mg via INTRAVENOUS
  Filled 2022-03-12 (×2): qty 2

## 2022-03-12 MED ORDER — ONDANSETRON HCL 4 MG/2ML IJ SOLN
4.0000 mg | Freq: Once | INTRAMUSCULAR | Status: AC
  Administered 2022-03-12: 4 mg via INTRAVENOUS
  Filled 2022-03-12: qty 2

## 2022-03-12 MED ORDER — SODIUM CHLORIDE 0.9 % IV SOLN
50.0000 ug/h | INTRAVENOUS | Status: DC
  Administered 2022-03-12 – 2022-03-14 (×5): 50 ug/h via INTRAVENOUS
  Filled 2022-03-12 (×7): qty 1

## 2022-03-12 MED ORDER — POLYETHYLENE GLYCOL 3350 17 G PO PACK: 17.0000 g | PACK | Freq: Every day | ORAL | Status: DC | PRN

## 2022-03-12 MED ORDER — ADULT MULTIVITAMIN W/MINERALS CH
1.0000 | ORAL_TABLET | Freq: Every day | ORAL | Status: DC
  Filled 2022-03-12: qty 1

## 2022-03-12 MED ORDER — SODIUM CHLORIDE 0.9 % IV BOLUS
1000.0000 mL | Freq: Once | INTRAVENOUS | Status: AC
  Administered 2022-03-12: 1000 mL via INTRAVENOUS

## 2022-03-12 MED ORDER — PANTOPRAZOLE SODIUM 40 MG IV SOLR
40.0000 mg | Freq: Once | INTRAVENOUS | Status: AC
  Administered 2022-03-12: 40 mg via INTRAVENOUS
  Filled 2022-03-12: qty 10

## 2022-03-12 MED ORDER — THIAMINE HCL 100 MG PO TABS: 100.0000 mg | ORAL_TABLET | Freq: Every day | ORAL | Status: DC

## 2022-03-12 MED ORDER — SODIUM CHLORIDE 0.9 % IV SOLN
1.0000 g | Freq: Once | INTRAVENOUS | Status: AC
  Administered 2022-03-12: 1 g via INTRAVENOUS
  Filled 2022-03-12: qty 10

## 2022-03-12 MED ORDER — PANTOPRAZOLE 80MG IVPB - SIMPLE MED: 80.0000 mg | Freq: Once | INTRAVENOUS | Status: DC

## 2022-03-12 MED ORDER — PANTOPRAZOLE INFUSION (NEW) - SIMPLE MED
8.0000 mg/h | INTRAVENOUS | Status: DC
  Administered 2022-03-12 – 2022-03-14 (×4): 8 mg/h via INTRAVENOUS
  Filled 2022-03-12 (×5): qty 100

## 2022-03-12 MED ORDER — FOLIC ACID 1 MG PO TABS: 1.0000 mg | ORAL_TABLET | Freq: Every day | ORAL | Status: DC

## 2022-03-12 MED ORDER — FOLIC ACID 5 MG/ML IJ SOLN
1.0000 mg | Freq: Every day | INTRAMUSCULAR | Status: DC
  Administered 2022-03-13 – 2022-03-14 (×2): 1 mg via INTRAVENOUS
  Filled 2022-03-12 (×2): qty 0.2

## 2022-03-12 NOTE — ED Notes (Signed)
Pt transported by RN to ICU with monitor, pt [id number]&OX4

## 2022-03-12 NOTE — ED Provider Notes (Signed)
[facility] Provider Note    Event Date/Time   First MD Initiated Contact with Patient 03/12/22 1708     (approximate)   History   Chief Complaint Abdominal Pain   HPI William Beasley is a 26 y.o. male, history of liver cirrhosis, pulmonary sarcoidosis, paraesophageal varices, presents to the emergency department for evaluation of hematemesis and right upper quadrant abdominal pain x 2 days.  He states that he quit drinking approximately 6 months ago, but drank 3 beers yesterday and has felt sick ever since.  He has had 2 episodes of bloody emesis, reportedly filling up the entire toilet bowl.  Endorsing severe, constant right upper quadrant pain.  Additionally endorsing black tarry stools as well.  Denies chest pain, shortness of breath, dysuria, rashes/lesions, or dizziness/lightheadedness.  History Limitations: No limitations.        Physical Exam  Triage Vital Signs: ED Triage Vitals  Enc Vitals Group     BP 03/12/22 1544 (!) 149/88     Pulse Rate 03/12/22 1544 88     Resp 03/12/22 1544 (!) 21     Temp 03/12/22 1544 98.6 F (37 C)     Temp Source 03/12/22 1544 Oral     SpO2 03/12/22 1544 97 %     Weight 03/12/22 1553 250 lb (113.4 kg)     Height 03/12/22 1553 5\' 9"  (1.753 m)     Head Circumference --      Peak Flow --      Pain Score 03/12/22 1553 10     Pain Loc --      Pain Edu? --      Excl. in GC? --     Most recent vital signs: Vitals:   03/12/22 2300 03/13/22 0000  BP: 135/78 124/66  Pulse: 77 78  Resp: 15 17  Temp:  97.6 F (36.4 C)  SpO2: 96% 96%    General: Awake, appears uncomfortable and in pain Skin: Warm, dry. No rashes or lesions.  Eyes: PERRL. Conjunctivae normal.  CV: Good peripheral perfusion.  Resp: Normal effort.  Neuro: At baseline. No gross neurological deficits.   Focused Exam: Tenderness appreciated in the right upper quadrant.  No ascites or distention the abdomen.  Physical Exam    ED Results /  Procedures / Treatments  Labs (all labs ordered are listed, but only abnormal results are displayed) Labs Reviewed  COMPREHENSIVE METABOLIC PANEL - Abnormal; Notable for the following components:      Result Value   Glucose, Bld 140 (*)    BUN <5 (*)    Creatinine, Ser 0.57 (*)    Total Protein 8.7 (*)    AST 74 (*)    Total Bilirubin 1.7 (*)    All other components within normal limits  CBC - Abnormal; Notable for the following components:   Platelets 71 (*)    All other components within normal limits  URINALYSIS, ROUTINE W REFLEX MICROSCOPIC - Abnormal; Notable for the following components:   Color, Urine AMBER (*)    APPearance HAZY (*)    Protein, ur 100 (*)    Leukocytes,Ua SMALL (*)    Bacteria, UA RARE (*)    All other components within normal limits  FIBRINOGEN - Abnormal; Notable for the following components:   Fibrinogen 199 (*)    All other components within normal limits  PROTIME-INR - Abnormal; Notable for the following components:   Prothrombin Time 15.9 (*)    INR 1.3 (*)  All other components within normal limits  URINE DRUG SCREEN, QUALITATIVE ([facility] ONLY) - Abnormal; Notable for the following components:   Cocaine Metabolite,Ur [place] POSITIVE (*)    Cannabinoid 50 Ng, Ur [place] POSITIVE (*)    All other components within normal limits  GLUCOSE, CAPILLARY - Abnormal; Notable for the following components:   Glucose-Capillary 161 (*)    All other components within normal limits  MRSA NEXT GEN BY PCR, NASAL  CULTURE, BLOOD (ROUTINE X 2)  CULTURE, BLOOD (ROUTINE X 2)  LIPASE, BLOOD  HIV ANTIBODY (ROUTINE TESTING W REFLEX)  HEMOGLOBIN AND HEMATOCRIT, BLOOD  APTT  CBC  BASIC METABOLIC PANEL  MAGNESIUM  PHOSPHORUS  HEMOGLOBIN AND HEMATOCRIT, BLOOD  HEMOGLOBIN AND HEMATOCRIT, BLOOD  HEMOGLOBIN AND HEMATOCRIT, BLOOD  TYPE AND SCREEN     EKG Sinus rhythm, rate of 83, no T-segment changes, no AV blocks, normal QRS, no QT prolongation, no axis  deviations.   RADIOLOGY  ED Provider Interpretation: I personally viewed and interpreted this chest x-ray, no evidence of acute pathology.  CT CHEST ABDOMEN PELVIS W CONTRAST  Result Date: 03/12/2022 CLINICAL DATA:  Esophageal varices  hx of alcoholic cirrhosis EXAM: CT CHEST, ABDOMEN, AND PELVIS WITH CONTRAST TECHNIQUE: Multidetector CT imaging of the chest, abdomen and pelvis was performed following the standard protocol during bolus administration of intravenous contrast. RADIATION DOSE REDUCTION: This exam was performed according to the departmental dose-optimization program which includes automated exposure control, adjustment of the mA and/or kV according to patient size and/or use of iterative reconstruction technique. CONTRAST:  OMNIPAQUE IOHEXOL 350 MG/ML SOLN COMPARISON:  01/18/2022 FINDINGS: CT CHEST FINDINGS Cardiovascular: Heart is normal size. Aorta is normal caliber. Mediastinum/Nodes: Mediastinal adenopathy. Right paratracheal lymph node has a short axis diameter of 17 mm. Subcarinal adenopathy has a short axis diameter of 20 mm. Bilateral hilar adenopathy. No axillary adenopathy. Trachea and thyroid unremarkable. Few distal esophageal varices noted. Lungs/Pleura: Lungs are clear. No focal airspace opacities or suspicious nodules. No effusions. Musculoskeletal: Mild bilateral gynecomastia. No acute bony abnormality. CT ABDOMEN PELVIS FINDINGS Hepatobiliary: Diffuse low-density throughout the liver compatible with fatty infiltration. Mildly nodular contours compatible with cirrhosis. No focal hepatic abnormality. Gallbladder unremarkable. Pancreas: No focal abnormality or ductal dilatation. Spleen: Insert splenomegaly with craniocaudal length 15.7 cm. Adrenals/Urinary Tract: No adrenal abnormality. No focal renal abnormality. No stones or hydronephrosis. Urinary bladder is unremarkable. Stomach/Bowel: Normal appendix. Stomach, large and small bowel grossly unremarkable.  Vascular/Lymphatic: No evidence of aneurysm or adenopathy. Upper abdominal and distal esophageal varices noted compatible with portal venous hypertension. Reproductive: No visible focal abnormality. Other: No free fluid or free air. Musculoskeletal: No acute bony abnormality. IMPRESSION: Changes of cirrhosis and fatty infiltration of the liver. Associated splenomegaly. Upper abdominal and distal esophageal varices compatible with portal venous hypertension. Mediastinal and bilateral hilar adenopathy. Mild bilateral gynecomastia. Electronically Signed   By: Charlett Nose M.D.   On: 03/12/2022 22:28   DG Chest 2 View  Result Date: 03/12/2022 CLINICAL DATA:  Right-sided abdominal pain and vomiting since this morning. Cirrhosis diagnosed 6 months ago. EXAM: CHEST - 2 VIEW COMPARISON:  06/02/2011 FINDINGS: Lungs are adequately inflated without focal airspace consolidation or effusion. Heart size is normal. Prominence of the hilar regions bilaterally. Remainder of the exam is unchanged. IMPRESSION: 1. No acute cardiopulmonary disease. 2. Prominence of the hilar regions bilaterally suggesting adenopathy. Recommend contrast-enhanced chest CT on elective basis for further evaluation. Electronically Signed   By: Elberta Fortis M.D.   On: 03/12/2022 18:33  PROCEDURES:  Critical Care performed: N/A.  Procedures    MEDICATIONS ORDERED IN ED: Medications  octreotide (SANDOSTATIN) 2 mcg/mL load via infusion 50 mcg (50 mcg Intravenous Bolus from Bag 03/12/22 1852)    And  octreotide (SANDOSTATIN) 500 mcg in sodium chloride 0.9 % 250 mL (2 mcg/mL) infusion (50 mcg/hr Intravenous New Bag/Given 03/12/22 1853)  docusate sodium (COLACE) capsule 100 mg (has no administration in time range)  polyethylene glycol (MIRALAX / GLYCOLAX) packet 17 g (has no administration in time range)  LORazepam (ATIVAN) tablet 1-4 mg ( Oral See Alternative 03/12/22 2308)    Or  LORazepam (ATIVAN) injection 1-4 mg (2 mg Intravenous Given  03/12/22 2308)  thiamine (VITAMIN B1) injection 100 mg (100 mg Intravenous Given 03/12/22 2232)  multivitamin with minerals tablet 1 tablet (0 tablets Oral Hold 03/12/22 2102)  folic acid injection 1 mg (has no administration in time range)  pantoprozole (PROTONIX) 80 mg /NS 100 mL infusion (8 mg/hr Intravenous New Bag/Given 03/12/22 2235)  ondansetron (ZOFRAN) injection 4 mg (4 mg Intravenous Given 03/12/22 2153)  ondansetron (ZOFRAN-ODT) disintegrating tablet 4 mg (4 mg Oral Given 03/12/22 1602)  sodium chloride 0.9 % bolus 1,000 mL (0 mLs Intravenous Stopped 03/12/22 1855)  HYDROmorphone (DILAUDID) injection 1 mg (1 mg Intravenous Given 03/12/22 1750)  pantoprazole (PROTONIX) injection 40 mg (40 mg Intravenous Given 03/12/22 1751)  cefTRIAXone (ROCEPHIN) 1 g in sodium chloride 0.9 % 100 mL IVPB (0 g Intravenous Stopped 03/12/22 1855)  prochlorperazine (COMPAZINE) injection 10 mg (10 mg Intravenous Given 03/12/22 1838)  ondansetron (ZOFRAN) injection 4 mg (4 mg Intravenous Given 03/12/22 1904)  iohexol (OMNIPAQUE) 350 MG/ML injection 100 mL (100 mLs Intravenous Contrast Given 03/12/22 2214)     IMPRESSION / MDM / ASSESSMENT AND PLAN / ED COURSE  I reviewed the triage vital signs and the nursing notes.                              Differential diagnosis includes, but is not limited to, esophageal varices, liver cirrhosis, hepatic sarcoidosis, Boerhaave syndrome, peptic ulcer disease  ED Course Patient appears uncomfortable and in pain, but clinically stable.  Vitals are within normal limits.  Given the patient's history, we will go ahead initiate Compazine, ceftriaxone, octreotide, and Protonix.  CBC shows no leukocytosis or anemia.  CMP shows no electrolyte abnormalities, AKI, or significant transaminitis.  Lipase unremarkable at 39.  Urinalysis shows small leukocytes and rare bacteria, but in the setting of no dysuria, unlikely urinary tract infection.  Assessment/Plan Patient presents with right upper  quadrant pain with 2 episodes of significant hematemesis over the past 2 days in the setting of prior diagnosis of liver cirrhosis with confirmed paraesophageal varices.  He is currently hemodynamically stable at this time.  Lab workup is reassuring.  No evidence of Boerhaave syndrome on x-ray.  Treated here with antiemetics, analgesics, ceftriaxone, octreotide, and Protonix.  He has responded well so far.  Spoke with the on-call gastroenterologist, Dr. Allegra Lai, who advised to continue treatment and to admit to ICU.  Spoke with the on-call intensivist, who agreed to admission.  Patient's presentation is most consistent with acute presentation with potential threat to life or bodily function.       FINAL CLINICAL IMPRESSION(S) / ED DIAGNOSES   Final diagnoses:  Hematemesis with nausea     Rx / DC Orders   ED Discharge Orders     None  Note:  This document was prepared using Dragon voice recognition software and may include unintentional dictation errors.   Varney Daily, PA 03/13/22 0104    Merwyn Katos, MD 03/14/22 [phone]

## 2022-03-12 NOTE — Progress Notes (Signed)
eLink Physician-Brief Progress Note Patient Name: William Beasley DOB: [birth year] MRN: [id number]   Date of Service  03/12/2022  HPI/Events of Note  Patient admitted with upper GI bleeding presumed variceal, against a background of a history of ETOH INDUCED CIRRHOSIS OF THE LIVER WITH ESOPHAGEAL VARICES, AND RECENT HOSPITALIZATION FOR VARICEAL BLEEDING.  eICU Interventions  New Patient Evaluation.        Raghad Lorenz U Stephannie Broner 03/12/2022, 10:00 PM

## 2022-03-12 NOTE — Progress Notes (Signed)
   03/12/22 2056  CIWA-Ar  Pulse Rate 83  Nausea and Vomiting 4  Tactile Disturbances 0  Tremor 1  Auditory Disturbances 0  Paroxysmal Sweats 0  Visual Disturbances 0  Anxiety 3  Headache, Fullness in Head 3  Agitation 0  Orientation and Clouding of Sensorium 1  CIWA-Ar Total 12     CIWA reported to CC NP, will continue to monitor.

## 2022-03-12 NOTE — ED Provider Triage Note (Signed)
Emergency Medicine Provider Triage Evaluation Note  William Beasley, a 26 y.o. male  was evaluated in triage.  Pt complains of NV and abd pain. Patient with a history of recently diagnosed cirrhosis and Etoh cessation x 6 months.  He reports he got custody of his young daughter, and drinks several beers to "celebrate".  Review of Systems  Positive: NV, hematemesis Negative: FCS  Physical Exam  BP (!) 149/88 (BP Location: Left Arm)   Pulse 88   Temp 98.6 F (37 C) (Oral)   Resp (!) 21   Ht 5\' 9"  (1.753 m)   Wt 113.4 kg   SpO2 97%   BMI 36.92 kg/m  Gen:   Awake, no distress   Resp:  Normal effort CTA MSK:   Moves extremities without difficulty  Other:  Soft, nontender  Medical Decision Making  Medically screening exam initiated at 4:00 PM.  Appropriate orders placed.  ANDRELL BERGESON was informed that the remainder of the evaluation will be completed by another provider, this initial triage assessment does not replace that evaluation, and the importance of remaining in the ED until their evaluation is complete.  Patient to the ED with sudden onset of nausea, vomiting, and some episodic hematemesis, after a recent diagnosis of cirrhosis.   Delaney Meigs, PA-C 03/12/22 1603

## 2022-03-12 NOTE — H&P (Cosign Needed Addendum)
NAME:  William Beasley, MRN:  [id number], DOB:  10/29/1995, LOS: 0 ADMISSION DATE:  03/12/2022, CONSULTATION DATE:  8/82023 REFERRING MD:  Donna Bernard  CHIEF COMPLAINT: Hematemesis  HPI  26 y.o with significant PMH of substance induced mood disorder, suicide ideation, traumatic epidural hematoma, EtOH abuse and complicated alcohol withdrawal, Alcoholic Cirrhosis, seizure like activity (12/2015), Gastritis below who presented to the ED with chief complaints of hematemesis  Patient was recently admitted to [facility] with hematemesis in the setting of newly diagnosed cirrhosis and Alcohol withdrawal. He was started on IV protonix, octreotide drip, IV ceftriaxone. Hemoglobin was closely monitored and remained stable. He underwent EGD on 1/17 that showed grade 1 esophageal varices and mild PHG. He was  discharged on carvedilol 6.25 mg BID and instructed to follow up with hepatology as outpatient. Since discharge, patient report that he has not used alcohol until yesterday morning. He report drinking a total of 3 beers and woke up this morning with hematemesis.  ED Course: In the emergency department, the temperature was 36.7C, the heart rate 88 beats/minute, the blood pressure  mm 132/78 Hg, the respiratory rate 20 breaths/minute, and the oxygen saturation 99% on RA.  Pertinent Labs/Diagnostics Findings: Chemistry:Na+/ K+:135/3.5  Glucose: 140 BUN/Cr. <5/0.57,  AST/ALT:74/43 CBC: WBC: 4.3, Plts: 71 Other Lab findings:   PCT:  Lactic acid:  UA + Leukocytes, rare bacteria Imaging:  CXR> No acute cardiopulmonary disease.  Patient was started onIV protonix, octreotide drip, and IV ceftriaxone. Due to high risk for bleeding and decompensation from suspected bleeding varices, PCCM was consulted for admission.  Past Medical History  substance induced mood disorder, suicide ideation, traumatic epidural hematoma, EtOH abuse and complicated alcohol withdrawal, Alcoholic Cirrhosis, seizure like activity  (12/2015), Gastritis  Significant Hospital Events   8/8: Admitted to ICU with hematemesis  Consults:  GI  Procedures:  None  Significant Diagnostic Tests:  8/8: Chest Xray>No acute cardiopulmonary process. 8/8: CTA Chest, abdomen and pelvis>  Micro Data:  8/8: Blood culture x2> 8/8: Urine Culture> 8/8: MRSA PCR>>   Antimicrobials:  8/8: Ceftriaxone  OBJECTIVE  Blood pressure (!) 143/79, pulse 84, temperature 98 F (36.7 C), temperature source Oral, resp. rate 20, height 5\' 9"  (1.753 m), weight 113.4 kg, SpO2 96 %.        Intake/Output Summary (Last 24 hours) at 03/12/2022 2020 Last data filed at 03/12/2022 1921 Gross per 24 hour  Intake 1100 ml  Output 50 ml  Net 1050 ml   Filed Weights   03/12/22 1553  Weight: 113.4 kg   Physical Examination  GENERAL: 26 year-old male critically ill patient lying in the bed with no acute distress.  EYES: Pupils equal, round, reactive to light and accommodation. No scleral icterus. Extraocular muscles intact.  HEENT: Head atraumatic, normocephalic. Oropharynx and nasopharynx clear.  NECK:  Supple, no jugular venous distention. No thyroid enlargement, no tenderness.  LUNGS: Normal breath sounds bilaterally, no wheezing, rales,rhonchi or crepitation. No use of accessory muscles of respiration.  CARDIOVASCULAR: S1, S2 normal. No murmurs, rubs, or gallops.  ABDOMEN: Soft, nontender, nondistended. Bowel sounds present. No organomegaly or mass.  EXTREMITIES: No pedal edema, cyanosis, or clubbing.  NEUROLOGIC: Cranial nerves II through XII are intact.  Muscle strength 5/5 in all extremities. Sensation intact. Gait not checked.  PSYCHIATRIC: The patient is alert and oriented x 3.  SKIN: No obvious rash, lesion, or ulcer.   Labs/imaging that I havepersonally reviewed  (right click and "Reselect all SmartList Selections" daily)  Labs   CBC: Recent Labs  Lab 03/12/22 1556  WBC 4.3  HGB 15.6  HCT 45.8  MCV 94.0  PLT 71*    Basic  Metabolic Panel: Recent Labs  Lab 03/12/22 1556  NA 135  K 3.5  CL 101  CO2 22  GLUCOSE 140*  BUN <5*  CREATININE 0.57*  CALCIUM 9.2   GFR: Estimated Creatinine Clearance: 172.2 mL/min (A) (by C-G formula based on SCr of 0.57 mg/dL (L)). Recent Labs  Lab 03/12/22 1556  WBC 4.3    Liver Function Tests: Recent Labs  Lab 03/12/22 1556  AST 74*  ALT 43  ALKPHOS 118  BILITOT 1.7*  PROT 8.7*  ALBUMIN 4.2   Recent Labs  Lab 03/12/22 1556  LIPASE 39   No results for input(s): "AMMONIA" in the last 168 hours.  ABG No results found for: "PHART", "PCO2ART", "PO2ART", "HCO3", "TCO2", "ACIDBASEDEF", "O2SAT"   Coagulation Profile: No results for input(s): "INR", "PROTIME" in the last 168 hours.  Cardiac Enzymes: No results for input(s): "CKTOTAL", "CKMB", "CKMBINDEX", "TROPONINI" in the last 168 hours.  HbA1C: No results found for: "HGBA1C"  CBG: No results for input(s): "GLUCAP" in the last 168 hours.  Review of Systems:   Review of Systems  Constitutional:  Negative for chills, diaphoresis, fever, malaise/fatigue and weight loss.  HENT: Negative.    Eyes: Negative.   Respiratory:  Negative for cough, hemoptysis, sputum production, shortness of breath and wheezing.   Cardiovascular: Negative.  Negative for chest pain, palpitations, orthopnea, claudication, leg swelling and PND.  Gastrointestinal:  Positive for abdominal pain, nausea and vomiting. Negative for blood in stool, constipation, diarrhea, heartburn and melena.  Genitourinary:  Positive for frequency and urgency. Negative for dysuria, flank pain and hematuria.  Musculoskeletal:  Negative for back pain, falls, joint pain, myalgias and neck pain.  Skin:  Negative for itching and rash.  Neurological: Negative.   Endo/Heme/Allergies: Negative.   Psychiatric/Behavioral:  Positive for depression and substance abuse. The patient is nervous/anxious and has insomnia.    Past Medical History  He,  has a past  medical history of Cirrhosis of liver (HCC).   Surgical History   History reviewed. No pertinent surgical history.   Social History   reports that he has never smoked. He has never used smokeless tobacco. He reports current alcohol use. He reports that he does not use drugs.   Family History   His family history is not on file.   Allergies No Known Allergies   Home Medications  Prior to Admission medications   Medication Sig Start Date End Date Taking? Authorizing Provider  carvedilol (COREG) 6.25 MG tablet Take 1 tablet (6.25 mg total) by mouth 2 (two) times daily. 01/22/22   Candis Schatz, PA-C  fluticasone (FLONASE) 50 MCG/ACT nasal spray Place 1 spray into both nostrils daily. 07/04/18 07/04/19  Willy Eddy, MD  folic acid (FOLVITE) 1 MG tablet Take 1 tablet (1 mg total) by mouth daily. 01/22/22   Candis Schatz, PA-C  Multiple Vitamins-Minerals (THERA-M) TABS Take 1 tablet by mouth daily. 08/22/21   [provider]  mupirocin ointment (BACTROBAN) 2 % Apply 1 application topically 3 (three) times daily. 05/12/16   Hassan Rowan, MD  pantoprazole (PROTONIX) 40 MG tablet Take 1 tablet (40 mg total) by mouth daily. 01/18/22 02/17/22  Gilles Chiquito, MD  thiamine (VITAMIN B-1) 100 MG tablet Take 1 tablet (100 mg total) by mouth daily. 01/22/22   Candis Schatz, PA-C  traMADol (ULTRAM) 50 MG tablet Take 1 tablet (50 mg total) by mouth every 6 (six) hours as needed. 01/22/22   Candis Schatz, PA-C    Scheduled Meds:  Melene Muller ON 03/13/2022] folic acid  1 mg Intravenous Daily   multivitamin with minerals  1 tablet Oral Daily   thiamine  100 mg Intravenous Daily   Continuous Infusions:  octreotide (SANDOSTATIN) 500 mcg in sodium chloride 0.9 % 250 mL (2 mcg/mL) infusion 50 mcg/hr (03/12/22 1853)   PRN Meds:.docusate sodium, LORazepam **OR** LORazepam, polyethylene glycol  Active Hospital Problem list     Assessment & Plan:  Acute/Chronic Hematemesis  Suspected  Gastritis, Variceal Bleed in the setting of Alcoholic Cirrhosis -IVF resuscitation to maintain MAP>65 -H&H monitoring q6h -Blood Consent.  Transfuse PRN Hgb<7 -Pantoprazole 80mg  IV x1 then gtt 8mg /hr -Octreotide IV x1 then 2mcg/hr drip -Continue Antibiotic Prophylaxis with ceftriaxone 1g IV daily  -NPO for pending endoscopy -GI Consult for EGD/Colonoscopy -Hold NSAIDs, steroids, ASA  EtOH Abuse hig risk for Withdrawal  PMHx: Chronic EtOH Abuse, DT's withdrawal seizures -Last drink 8/7 admitted to drinking 3 beers, Cocaine and Marijuana -Check EtOH, UTox -Check CMP, INR, Daily BMP+Mg -Banana Bag x1 (1L NS or D5W, 100mg  thiamine, 1mg  folate, 1amp/tab MVI, 3g magnesium sulfate) if not tolerating PO -Daily Thiamine, Folate, MVI once tolerating PO -CIWA +/- Standing Protocol -SW consult for cessation resources -PT/OT evaluation for mobility  Thrombocytopenia Likely Alcohol induced Plts 71  -Monitor s/s bleeding -Follow CBC  Best practice:  Diet:  NPO Pain/Anxiety/Delirium protocol (if indicated): Yes (RASS goal 0) VAP protocol (if indicated): Not indicated DVT prophylaxis: Contraindicated GI prophylaxis: PPI Glucose control:  SSI No Central venous access:  N/A Arterial line:  N/A Foley:  N/A Mobility:  bed rest  PT consulted: N/A Last date of multidisciplinary goals of care discussion [8/8] Code Status:  full code Disposition: ICU   = Goals of Care = Code Status Order: FULL  Primary Emergency Contact: 45m, Home Phone: [phone] Wishes to pursue full aggressive treatment and intervention options, including CPR and intubation, but goals of care will be addressed on going with family if that should become necessary.  Critical care time: 45 minutes       , DNP, CCRN, FNP-C, AGACNP-BC Acute Care Nurse Practitioner [facility]  PCCM on call pager [phone] until 7 am

## 2022-03-12 NOTE — ED Triage Notes (Signed)
Pt has been vomiting since this AM and right side abd pain. Pt was diagnosed with cirrhosis 6 months ago. Pt drank 3 beers yesterday after quitting last 6 months

## 2022-03-12 NOTE — ED Notes (Signed)
ER dispo entered in error on this pt.

## 2022-03-13 DIAGNOSIS — K922 Gastrointestinal hemorrhage, unspecified: Secondary | ICD-10-CM | POA: Diagnosis not present

## 2022-03-13 DIAGNOSIS — T405X1A Poisoning by cocaine, accidental (unintentional), initial encounter: Secondary | ICD-10-CM

## 2022-03-13 DIAGNOSIS — K7460 Unspecified cirrhosis of liver: Secondary | ICD-10-CM

## 2022-03-13 LAB — HEMOGLOBIN AND HEMATOCRIT, BLOOD
HCT: 41.4 % (ref 39.0–52.0)
HCT: 42.6 % (ref 39.0–52.0)
HCT: 43.2 % (ref 39.0–52.0)
Hemoglobin: 14.2 g/dL (ref 13.0–17.0)
Hemoglobin: 14.7 g/dL (ref 13.0–17.0)
Hemoglobin: 15 g/dL (ref 13.0–17.0)

## 2022-03-13 LAB — BASIC METABOLIC PANEL
Anion gap: 9 (ref 5–15)
BUN: <5 mg/dL — ABNORMAL LOW (ref 6–20)
CO2: 26 mmol/L (ref 22–32)
Calcium: 8.9 mg/dL (ref 8.9–10.3)
Chloride: 102 mmol/L (ref 98–111)
Creatinine, Ser: 0.51 mg/dL — ABNORMAL LOW (ref 0.61–1.24)
GFR, Estimated: >60 mL/min (ref >60)
Glucose, Bld: 126 mg/dL — ABNORMAL HIGH (ref 70–99)
Potassium: 3.6 mmol/L (ref 3.5–5.1)
Sodium: 137 mmol/L (ref 135–145)

## 2022-03-13 LAB — CBC
HCT: 41.8 % (ref 39.0–52.0)
Hemoglobin: 14.2 g/dL (ref 13.0–17.0)
MCH: 31.9 pg (ref 26.0–34.0)
MCHC: 34 g/dL (ref 30.0–36.0)
MCV: 93.9 fL (ref 80.0–100.0)
Platelets: 50 10*3/uL — ABNORMAL LOW (ref 150–400)
RBC: 4.45 MIL/uL (ref 4.22–5.81)
RDW: 14.4 % (ref 11.5–15.5)
WBC: 3.6 10*3/uL — ABNORMAL LOW (ref 4.0–10.5)
nRBC: 0 % (ref 0.0–0.2)

## 2022-03-13 LAB — MAGNESIUM: Magnesium: 1.6 mg/dL — ABNORMAL LOW (ref 1.7–2.4)

## 2022-03-13 LAB — HIV ANTIBODY (ROUTINE TESTING W REFLEX): HIV Screen 4th Generation wRfx: NONREACTIVE

## 2022-03-13 LAB — PHOSPHORUS: Phosphorus: 3.5 mg/dL (ref 2.5–4.6)

## 2022-03-13 MED ORDER — CEFTRIAXONE SODIUM 1 G IJ SOLR
1.0000 g | Freq: Once | INTRAMUSCULAR | Status: DC
Start: 2022-03-13 — End: 2022-03-13

## 2022-03-13 MED ORDER — SODIUM CHLORIDE 0.9 % IV SOLN
1.0000 g | INTRAVENOUS | Status: DC
  Administered 2022-03-13: 1 g via INTRAVENOUS
  Filled 2022-03-13 (×2): qty 10

## 2022-03-13 MED ORDER — MAGNESIUM SULFATE 2 GM/50ML IV SOLN
2.0000 g | Freq: Once | INTRAVENOUS | Status: AC
  Administered 2022-03-13: 2 g via INTRAVENOUS
  Filled 2022-03-13: qty 50

## 2022-03-13 MED ORDER — MORPHINE SULFATE (PF) 2 MG/ML IV SOLN
2.0000 mg | Freq: Once | INTRAVENOUS | Status: AC
  Administered 2022-03-13: 2 mg via INTRAVENOUS
  Filled 2022-03-13: qty 1

## 2022-03-13 MED ORDER — TRAMADOL HCL 50 MG PO TABS
50.0000 mg | ORAL_TABLET | Freq: Four times a day (QID) | ORAL | Status: DC | PRN
  Administered 2022-03-13: 50 mg via ORAL
  Filled 2022-03-13: qty 1

## 2022-03-13 MED ORDER — TRAZODONE HCL 50 MG PO TABS
50.0000 mg | ORAL_TABLET | Freq: Every evening | ORAL | Status: DC | PRN
  Administered 2022-03-13: 50 mg via ORAL
  Filled 2022-03-13: qty 1

## 2022-03-13 MED ORDER — BOOST / RESOURCE BREEZE PO LIQD CUSTOM
1.0000 | Freq: Three times a day (TID) | ORAL | Status: DC
  Administered 2022-03-13 – 2022-03-14 (×4): 1 via ORAL

## 2022-03-13 MED ORDER — METOPROLOL TARTRATE 5 MG/5ML IV SOLN: 2.5000 mg | Freq: Four times a day (QID) | INTRAVENOUS | Status: DC | PRN

## 2022-03-13 MED ORDER — CHLORHEXIDINE GLUCONATE CLOTH 2 % EX PADS
6.0000 | MEDICATED_PAD | Freq: Every day | CUTANEOUS | Status: DC
  Administered 2022-03-13 – 2022-03-14 (×2): 6 via TOPICAL

## 2022-03-13 NOTE — Plan of Care (Signed)
  Problem: Education: Goal: Knowledge of General Education information will improve Description Including pain rating scale, medication(s)/side effects and non-pharmacologic comfort measures Outcome: Progressing   Problem: Health Behavior/Discharge Planning: Goal: Ability to manage health-related needs will improve Outcome: Progressing   

## 2022-03-13 NOTE — Progress Notes (Signed)
CHIEF COMPLAINT:   Chief Complaint  Patient presents with   Abdominal Pain   Follow p GIB   Subjective  Resting comfortably No more active bleeding   On OCTREOTIDE AND PPI infusions Plan for EGD today      Objective      Review of Systems: Gen:  Denies  fever, sweats, chills weight loss  HEENT: Denies blurred vision, double vision, ear pain, eye pain, hearing loss, nose bleeds, sore throat Cardiac:  No dizziness, chest pain or heaviness, chest tightness,edema, No JVD Resp:   No cough, -sputum production, -shortness of breath,-wheezing, -hemoptysis,  Other:  All other systems negative   Physical Examination:   General Appearance: No distress  EYES PERRLA, EOM intact.   NECK Supple, No JVD Pulmonary: normal breath sounds, No wheezing.  CardiovascularNormal S1,S2.  No m/r/g.   Abdomen: Benign, Soft, non-tender. ALL OTHER ROS ARE NEGATIVE   VITALS:  height is 5\' 9"  (1.753 m) and weight is 113.3 kg. His oral temperature is 97.6 F (36.4 C). His blood pressure is 118/75 and his pulse is 80. His respiration is 17 and oxygen saturation is 94%.   I personally reviewed Labs under Results section.  Radiology Reports CT CHEST ABDOMEN PELVIS W CONTRAST  Result Date: 03/12/2022 CLINICAL DATA:  Esophageal varices  hx of alcoholic cirrhosis EXAM: CT CHEST, ABDOMEN, AND PELVIS WITH CONTRAST TECHNIQUE: Multidetector CT imaging of the chest, abdomen and pelvis was performed following the standard protocol during bolus administration of intravenous contrast. RADIATION DOSE REDUCTION: This exam was performed according to the departmental dose-optimization program which includes automated exposure control, adjustment of the mA and/or kV according to patient size and/or use of iterative reconstruction technique. CONTRAST:  05/12/2022 OMNIPAQUE IOHEXOL 350 MG/ML SOLN COMPARISON:  01/18/2022 FINDINGS: CT CHEST FINDINGS Cardiovascular: Heart is normal size. Aorta is normal caliber.  Mediastinum/Nodes: Mediastinal adenopathy. Right paratracheal lymph node has a short axis diameter of 17 mm. Subcarinal adenopathy has a short axis diameter of 20 mm. Bilateral hilar adenopathy. No axillary adenopathy. Trachea and thyroid unremarkable. Few distal esophageal varices noted. Lungs/Pleura: Lungs are clear. No focal airspace opacities or suspicious nodules. No effusions. Musculoskeletal: Mild bilateral gynecomastia. No acute bony abnormality. CT ABDOMEN PELVIS FINDINGS Hepatobiliary: Diffuse low-density throughout the liver compatible with fatty infiltration. Mildly nodular contours compatible with cirrhosis. No focal hepatic abnormality. Gallbladder unremarkable. Pancreas: No focal abnormality or ductal dilatation. Spleen: Insert splenomegaly with craniocaudal length 15.7 cm. Adrenals/Urinary Tract: No adrenal abnormality. No focal renal abnormality. No stones or hydronephrosis. Urinary bladder is unremarkable. Stomach/Bowel: Normal appendix. Stomach, large and small bowel grossly unremarkable. Vascular/Lymphatic: No evidence of aneurysm or adenopathy. Upper abdominal and distal esophageal varices noted compatible with portal venous hypertension. Reproductive: No visible focal abnormality. Other: No free fluid or free air. Musculoskeletal: No acute bony abnormality. IMPRESSION: Changes of cirrhosis and fatty infiltration of the liver. Associated splenomegaly. Upper abdominal and distal esophageal varices compatible with portal venous hypertension. Mediastinal and bilateral hilar adenopathy. Mild bilateral gynecomastia. Electronically Signed   By: 01/20/2022 M.D.   On: 03/12/2022 22:28   DG Chest 2 View  Result Date: 03/12/2022 CLINICAL DATA:  Right-sided abdominal pain and vomiting since this morning. Cirrhosis diagnosed 6 months ago. EXAM: CHEST - 2 VIEW COMPARISON:  06/02/2011 FINDINGS: Lungs are adequately inflated without focal airspace consolidation or effusion. Heart size is normal.  Prominence of the hilar regions bilaterally. Remainder of the exam is unchanged. IMPRESSION: 1. No acute cardiopulmonary disease. 2. Prominence of the  hilar regions bilaterally suggesting adenopathy. Recommend contrast-enhanced chest CT on elective basis for further evaluation. Electronically Signed   By: Elberta Fortis M.D.   On: 03/12/2022 18:33       Assessment/Plan:     26 yo male with ETOH liver cirrhosis and UGIB with and Acute/Chronic Hematemesis WITH COCAINE POISONING/TOXICITY  GIB Suspected Gastritis, Variceal Bleed in the setting of Alcoholic Cirrhosis PLAN FOR EGD PPI DRIP OCTREOTIDE -Continue Antibiotic Prophylaxis with ceftriaxone 1g IV daily  -NPO for pending endoscopy -GI Consult for EGD/Colonoscopy -Hold NSAIDs, steroids, ASA     EtOH Abuse hig risk for Withdrawal  PMHx: Chronic EtOH Abuse, DT's withdrawal seizures -Last drink 8/7 admitted to drinking 3 beers, Cocaine and Marijuana -Daily Thiamine, Folate, MVI once tolerating PO -CIWA +/- Standing Protocol -SW consult for cessation resources -PT/OT evaluation for mobility       DVT/GI PRX  assessed I Assessed the need for Labs I Assessed the need for Foley I Assessed the need for Central Venous Line Family Discussion when available I Assessed the need for Mobilization I made an Assessment of medications to be adjusted accordingly Safety Risk assessment completed  CASE DISCUSSED IN MULTIDISCIPLINARY ROUNDS WITH ICU TEAM     Critical Care Time devoted to patient care services described in this note is 45 minutes.  Critical care was necessary to treat /prevent imminent and life-threatening deterioration. Overall, patient is critically ill, prognosis is guarded.    Lucie Leather, M.D.  [facility]  Medical Director [facility] Medical Director [facility] Cardio-Pulmonary Department

## 2022-03-13 NOTE — Consult Note (Signed)
PHARMACY CONSULT NOTE  Pharmacy Consult for Electrolyte Monitoring and Replacement   Recent Labs: Potassium (mmol/L)  Date Value  03/13/2022 3.6   Magnesium (mg/dL)  Date Value  28/78/6767 1.6 (L)   Calcium (mg/dL)  Date Value  20/94/7096 8.9   Albumin (g/dL)  Date Value  28/36/6294 4.2   Phosphorus (mg/dL)  Date Value  76/54/6503 3.5   Sodium (mmol/L)  Date Value  03/13/2022 137   Assessment: Patient is a 26 y/o M with medical history including substance induced mood disorder, suicidal ideation, traumatic epidural hematoma, EtOH use disorder, alcoholic cirrhosis, gastritis who presented to the ED 8/8 with abdominal pain and hematemesis and was subsequently admitted with suspected GI bleeding secondary to gastritis or esophageal varices. Pharmacy consulted to assist with electrolyte monitoring and replacement as indicated.  Goal of Therapy:  Electrolytes within normal limits  Plan:  --Mg 1.6, magnesium sulfate 2 g IV x 1 per PCCM --Follow-up electrolytes with AM labs tomorrow morning  Tressie Ellis 03/13/2022 8:23 AM

## 2022-03-13 NOTE — Consult Note (Signed)
Cephas Darby, MD [address]  [place]  [place], [place] [address]  Main: [phone]  Fax: [phone] Pager: [phone]   Consultation  Referring Provider:     No ref. provider found Primary Care Physician:  Pcp, No Primary Gastroenterologist: Dr. Manuella Ghazi, transplant hepatologist at [facility] Reason for Consultation: Hematemesis  Date of Admission:  03/12/2022 Date of Consultation:  03/13/2022         HPI:   William Beasley is a 26 y.o. 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  He reported 2 episodes of bloody emesis, right upper quadrant pain and 4-5 episodes of black tarry stools.  His last episode of hematemesis was yesterday afternoon.  Patient was hemodynamically stable upon arrival to the ER, labs revealed hemoglobin 15.6, which was at his baseline, platelets 71, normal BUN/creatinine ratio, AST 74, ALT 43, T. bili 1.7.  His urine drug screen was positive for cocaine and cannabis.  HIV negative.  PT/INR 15.9/1.3.  Patient is started on IV Rocephin for SBP prophylaxis, pantoprazole drip and octreotide drips, kept NPO.  He underwent CT chest abdomen pelvis with contrast which revealed changes of cirrhosis and fatty liver, associated splenomegaly, esophageal varices, mediastinal and bilateral hilar adenopathy.  When I interviewed the patient in ICU, he was comfortable, he denies any further episodes of melena or hematemesis.  Patient denied any abdominal pain, nausea or vomiting, swelling of legs or abdominal distention, fever, chills.  Patient is feeling guilty that he drank alcohol yesterday and he states that he is not going to drink  again.  NSAIDs: None  Antiplts/Anticoagulants/Anti thrombotics: None  GI Procedures:  Upper endoscopy 08/21/2021 at [facility] performed for history of hematemesis and cirrhosis rule out esophageal varices    - Grade I esophageal varices.                         - Portal hypertensive gastropathy.                         - Normal examined duodenum.                         - No specimens collected.   Past Medical History:  Diagnosis Date  . Cirrhosis of liver ([facility])     History reviewed. No pertinent surgical history.   Current Facility-Administered Medications:  .  cefTRIAXone (ROCEPHIN) 1 g in sodium chloride 0.9 % 100 mL IVPB, 1 g, Intravenous, Q24H, Kasa, Kurian, MD .  Chlorhexidine Gluconate Cloth 2 % PADS 6 each, 6 each, Topical, Daily, Lang Snow, NP, 6 each at 03/13/22 0935 .  docusate sodium (COLACE) capsule 100 mg, 100 mg, Oral, BID PRN, Stark Klein, Bing Neighbors, NP .  feeding supplement (BOOST / RESOURCE BREEZE) liquid 1 Container, 1 Container, Oral, TID BM, Flora Lipps, MD, 1 Container at 03/13/22 1126 .  folic acid injection 1 mg, 1 mg, Intravenous, Daily, Ouma, Bing Neighbors, NP, 1 mg at 03/13/22 0936 .  LORazepam (ATIVAN) tablet 1-4 mg, 1-4 mg, Oral, Q1H PRN **OR** LORazepam (ATIVAN) injection 1-4 mg, 1-4 mg, Intravenous, Q1H PRN,  Jimmye Norman, NP, 2 mg at 03/12/22 2308 .  multivitamin with minerals tablet 1 tablet, 1 tablet, Oral, Daily, Ouma, Hubbard Hartshorn, NP .  [COMPLETED] octreotide (SANDOSTATIN) 2 mcg/mL load via infusion 50 mcg, 50 mcg, Intravenous, Once, 50 mcg at 03/12/22 1852 **AND** octreotide (SANDOSTATIN) 500 mcg in sodium chloride 0.9 % 250 mL (2 mcg/mL) infusion, 50 mcg/hr, Intravenous, Continuous, Varney Daily, PA, Last Rate: 25 mL/hr at 03/13/22 0800, 50 mcg/hr at 03/13/22 0800 .  ondansetron (ZOFRAN) injection 4 mg, 4 mg, Intravenous, Q6H PRN, Jimmye Norman, NP, 4 mg at 03/13/22 1126 .  pantoprozole (PROTONIX) 80  mg /NS 100 mL infusion, 8 mg/hr, Intravenous, Continuous, Ouma, Hubbard Hartshorn, NP, Last Rate: 10 mL/hr at 03/13/22 0800, 8 mg/hr at 03/13/22 0800 .  polyethylene glycol (MIRALAX / GLYCOLAX) packet 17 g, 17 g, Oral, Daily PRN, Ouma, Hubbard Hartshorn, NP .  [DISCONTINUED] thiamine (VITAMIN B1) tablet 100 mg, 100 mg, Oral, Daily **OR** thiamine (VITAMIN B1) injection 100 mg, 100 mg, Intravenous, Daily, Ouma, Hubbard Hartshorn, NP, 100 mg at 03/13/22 0935   History reviewed. No pertinent family history.   Social History   Tobacco Use  . Smoking status: Never  . Smokeless tobacco: Never  Vaping Use  . Vaping Use: Never used  Substance Use Topics  . Alcohol use: Yes    Comment: 3 beer  . Drug use: No    Allergies as of 03/12/2022  . (No Known Allergies)    Review of Systems:    All systems reviewed and negative except where noted in HPI.   Physical Exam:  Vital signs in last 24 hours: Temp:  [97.6 F (36.4 C)-98.6 F (37 C)] 98 F (36.7 C) (08/09 0800) Pulse Rate:  [67-92] 79 (08/09 1000) Resp:  [14-21] 19 (08/09 1000) BP: (110-149)/(65-88) 139/81 (08/09 1000) SpO2:  [94 %-97 %] 94 % (08/09 1000) Weight:  [113.3 kg-113.4 kg] 113.3 kg (08/09 0500) Last BM Date : 03/12/22 General:   Pleasant, cooperative in NAD Head:  Normocephalic and atraumatic. Eyes:   No icterus.   Conjunctiva pink. PERRLA. Ears:  Normal auditory acuity. Neck:  Supple; no masses or thyroidomegaly Lungs: Respirations even and unlabored. Lungs clear to auscultation bilaterally.   No wheezes, crackles, or rhonchi.  Heart:  Regular rate and rhythm;  Without murmur, clicks, rubs or gallops Abdomen:  Soft, nondistended, nontender. Normal bowel sounds. No appreciable masses or hepatomegaly.  No rebound or guarding.  Rectal:  Not performed. Msk:  Symmetrical without gross deformities.  Strength normal Extremities:  Without edema, cyanosis or clubbing. Neurologic:  Alert and oriented x3;  grossly normal  neurologically. Psych:  Alert and cooperative. Normal affect.  LAB RESULTS:    Latest Ref Rng & Units 03/13/2022   11:59 AM 03/13/2022    2:08 AM 03/12/2022   11:57 PM  CBC  WBC 4.0 - 10.5 K/uL  3.6    Hemoglobin 13.0 - 17.0 g/dL 07.4  09.7  96.4   Hematocrit 39.0 - 52.0 % 43.2  41.8  41.4   Platelets 150 - 400 K/uL  50      BMET    Latest Ref Rng & Units 03/13/2022    2:08 AM 03/12/2022    3:56 PM 01/18/2022   12:58 PM  BMP  Glucose 70 - 99 mg/dL 189  373  749   BUN 6 - 20 mg/dL <5  <5  <5   Creatinine 0.61 - 1.24 mg/dL 6.64  6.60  5.63  Sodium 135 - 145 mmol/L 137  135  139   Potassium 3.5 - 5.1 mmol/L 3.6  3.5  3.3   Chloride 98 - 111 mmol/L 102  101  105   CO2 22 - 32 mmol/L $RemoveB'26  22  22   'RROcanKx$ Calcium 8.9 - 10.3 mg/dL 8.9  9.2  9.1     LFT    Latest Ref Rng & Units 03/12/2022    3:56 PM 01/18/2022   12:58 PM 07/26/2019    8:03 PM  Hepatic Function  Total Protein 6.5 - 8.1 g/dL 8.7  8.7  9.5   Albumin 3.5 - 5.0 g/dL 4.2  4.1  4.8   AST 15 - 41 U/L 74  59  116   ALT 0 - 44 U/L 43  37  199   Alk Phosphatase 38 - 126 U/L 118  96  100   Total Bilirubin 0.3 - 1.2 mg/dL 1.7  1.3  1.0      STUDIES: CT CHEST ABDOMEN PELVIS W CONTRAST  Result Date: 03/12/2022 CLINICAL DATA:  Esophageal varices  hx of alcoholic cirrhosis EXAM: CT CHEST, ABDOMEN, AND PELVIS WITH CONTRAST TECHNIQUE: Multidetector CT imaging of the chest, abdomen and pelvis was performed following the standard protocol during bolus administration of intravenous contrast. RADIATION DOSE REDUCTION: This exam was performed according to the departmental dose-optimization program which includes automated exposure control, adjustment of the mA and/or kV according to patient size and/or use of iterative reconstruction technique. CONTRAST:  116mL OMNIPAQUE IOHEXOL 350 MG/ML SOLN COMPARISON:  01/18/2022 FINDINGS: CT CHEST FINDINGS Cardiovascular: Heart is normal size. Aorta is normal caliber. Mediastinum/Nodes: Mediastinal adenopathy.  Right paratracheal lymph node has a short axis diameter of 17 mm. Subcarinal adenopathy has a short axis diameter of 20 mm. Bilateral hilar adenopathy. No axillary adenopathy. Trachea and thyroid unremarkable. Few distal esophageal varices noted. Lungs/Pleura: Lungs are clear. No focal airspace opacities or suspicious nodules. No effusions. Musculoskeletal: Mild bilateral gynecomastia. No acute bony abnormality. CT ABDOMEN PELVIS FINDINGS Hepatobiliary: Diffuse low-density throughout the liver compatible with fatty infiltration. Mildly nodular contours compatible with cirrhosis. No focal hepatic abnormality. Gallbladder unremarkable. Pancreas: No focal abnormality or ductal dilatation. Spleen: Insert splenomegaly with craniocaudal length 15.7 cm. Adrenals/Urinary Tract: No adrenal abnormality. No focal renal abnormality. No stones or hydronephrosis. Urinary bladder is unremarkable. Stomach/Bowel: Normal appendix. Stomach, large and small bowel grossly unremarkable. Vascular/Lymphatic: No evidence of aneurysm or adenopathy. Upper abdominal and distal esophageal varices noted compatible with portal venous hypertension. Reproductive: No visible focal abnormality. Other: No free fluid or free air. Musculoskeletal: No acute bony abnormality. IMPRESSION: Changes of cirrhosis and fatty infiltration of the liver. Associated splenomegaly. Upper abdominal and distal esophageal varices compatible with portal venous hypertension. Mediastinal and bilateral hilar adenopathy. Mild bilateral gynecomastia. Electronically Signed   By: Rolm Baptise M.D.   On: 03/12/2022 22:28   DG Chest 2 View  Result Date: 03/12/2022 CLINICAL DATA:  Right-sided abdominal pain and vomiting since this morning. Cirrhosis diagnosed 6 months ago. EXAM: CHEST - 2 VIEW COMPARISON:  06/02/2011 FINDINGS: Lungs are adequately inflated without focal airspace consolidation or effusion. Heart size is normal. Prominence of the hilar regions bilaterally.  Remainder of the exam is unchanged. IMPRESSION: 1. No acute cardiopulmonary disease. 2. Prominence of the hilar regions bilaterally suggesting adenopathy. Recommend contrast-enhanced chest CT on elective basis for further evaluation. Electronically Signed   By: Marin Olp M.D.   On: 03/12/2022 18:33      Impression / Plan:  William Beasley is a 26 y.o. male with pulmonary sarcoidosis, hepatic sarcoidosis, history of alcohol abuse, cirrhosis of liver with esophageal varices presented with hematemesis  Hematemesis Differentials include erosive esophagitis or alcohol induced gastritis or esophageal varices or portal hypertensive gastropathy or peptic ulcer disease Continue pantoprazole and octreotide drips Maintain 2 large-bore IVs Monitor CBC to maintain hemoglobin above 7 and platelets above 50 Okay to start clear liquid diet Cannot proceed with upper endoscopy today given that patient is hemodynamically stable and cocaine positive. Recommend to check urine toxicology screen again tomorrow and if negative for cocaine, can proceed with upper endoscopy tomorrow N.p.o. effective 5 AM tomorrow in anticipation of upper endoscopy  Cirrhosis of liver: Combination of sarcoidosis and alcohol abuse Continue IV Rocephin as above for SBP prophylaxis Appears euvolemic Patient to resume Coreg at the time of discharge EGD as above to evaluate esophageal varices and banding if needed No evidence of liver lesions based on imaging PSE: None Complete abstinence from alcohol use and drug abuse Follow-up with transplant hepatologist, Dr. Manuella Ghazi at [facility] upon discharge   Thank you for involving me in the care of this patient.  GI will follow along with you    LOS: 1 day   Sherri Sear, MD  03/13/2022, 12:52 PM    Note: This dictation was prepared with Dragon dictation along with smaller phrase technology. Any transcriptional errors that result from this process are unintentional.

## 2022-03-14 DIAGNOSIS — K920 Hematemesis: Secondary | ICD-10-CM

## 2022-03-14 DIAGNOSIS — T405X1D Poisoning by cocaine, accidental (unintentional), subsequent encounter: Secondary | ICD-10-CM

## 2022-03-14 DIAGNOSIS — K7030 Alcoholic cirrhosis of liver without ascites: Secondary | ICD-10-CM

## 2022-03-14 LAB — BASIC METABOLIC PANEL
Anion gap: 6 (ref 5–15)
BUN: 6 mg/dL (ref 6–20)
CO2: 26 mmol/L (ref 22–32)
Calcium: 9.1 mg/dL (ref 8.9–10.3)
Chloride: 104 mmol/L (ref 98–111)
Creatinine, Ser: 0.62 mg/dL (ref 0.61–1.24)
GFR, Estimated: >60 mL/min (ref >60)
Glucose, Bld: 103 mg/dL — ABNORMAL HIGH (ref 70–99)
Potassium: 3.6 mmol/L (ref 3.5–5.1)
Sodium: 136 mmol/L (ref 135–145)

## 2022-03-14 LAB — HEMOGLOBIN AND HEMATOCRIT, BLOOD
HCT: 42.6 % (ref 39.0–52.0)
Hemoglobin: 14.3 g/dL (ref 13.0–17.0)

## 2022-03-14 LAB — URINE DRUG SCREEN, QUALITATIVE (ARMC ONLY)
Amphetamines, Ur Screen: NOT DETECTED
Barbiturates, Ur Screen: NOT DETECTED
Benzodiazepine, Ur Scrn: POSITIVE — AB
Cannabinoid 50 Ng, Ur ~~LOC~~: POSITIVE — AB
Cocaine Metabolite,Ur ~~LOC~~: POSITIVE — AB
MDMA (Ecstasy)Ur Screen: NOT DETECTED
Methadone Scn, Ur: NOT DETECTED
Opiate, Ur Screen: POSITIVE — AB
Phencyclidine (PCP) Ur S: NOT DETECTED
Tricyclic, Ur Screen: NOT DETECTED

## 2022-03-14 LAB — CBC WITH DIFFERENTIAL/PLATELET
Abs Immature Granulocytes: 0.01 10*3/uL (ref 0.00–0.07)
Basophils Absolute: 0 10*3/uL (ref 0.0–0.1)
Basophils Relative: 1 %
Eosinophils Absolute: 0.1 10*3/uL (ref 0.0–0.5)
Eosinophils Relative: 4 %
HCT: 41.4 % (ref 39.0–52.0)
Hemoglobin: 14.3 g/dL (ref 13.0–17.0)
Immature Granulocytes: 0 %
Lymphocytes Relative: 29 %
Lymphs Abs: 0.8 10*3/uL (ref 0.7–4.0)
MCH: 32.6 pg (ref 26.0–34.0)
MCHC: 34.5 g/dL (ref 30.0–36.0)
MCV: 94.3 fL (ref 80.0–100.0)
Monocytes Absolute: 0.3 10*3/uL (ref 0.1–1.0)
Monocytes Relative: 11 %
Neutro Abs: 1.4 10*3/uL — ABNORMAL LOW (ref 1.7–7.7)
Neutrophils Relative %: 55 %
Platelets: 43 10*3/uL — ABNORMAL LOW (ref 150–400)
RBC: 4.39 MIL/uL (ref 4.22–5.81)
RDW: 14.4 % (ref 11.5–15.5)
WBC: 2.6 10*3/uL — ABNORMAL LOW (ref 4.0–10.5)
nRBC: 0 % (ref 0.0–0.2)

## 2022-03-14 LAB — PHOSPHORUS: Phosphorus: 3.9 mg/dL (ref 2.5–4.6)

## 2022-03-14 LAB — MAGNESIUM: Magnesium: 2 mg/dL (ref 1.7–2.4)

## 2022-03-14 MED ORDER — CARVEDILOL 6.25 MG PO TABS: 6.2500 mg | ORAL_TABLET | Freq: Two times a day (BID) | ORAL | 1 refills | Status: AC

## 2022-03-14 MED ORDER — PANTOPRAZOLE SODIUM 40 MG PO TBEC: 40.0000 mg | DELAYED_RELEASE_TABLET | Freq: Two times a day (BID) | ORAL | 1 refills | Status: DC

## 2022-03-14 MED ORDER — THIAMINE MONONITRATE 100 MG PO TABS: 100.0000 mg | ORAL_TABLET | Freq: Every day | ORAL | 1 refills | Status: DC

## 2022-03-14 MED ORDER — HYDRALAZINE HCL 20 MG/ML IJ SOLN: 10.0000 mg | INTRAMUSCULAR | Status: DC | PRN

## 2022-03-14 MED ORDER — FOLIC ACID 1 MG PO TABS: 1.0000 mg | ORAL_TABLET | Freq: Every day | ORAL | 1 refills | Status: DC

## 2022-03-14 NOTE — Progress Notes (Signed)
Pt discharged to private residence. Wheeled out by Diplomatic Services operational officer. Transported home by mother.

## 2022-03-14 NOTE — TOC Initial Note (Signed)
Transition of Care [facility]) - Initial/Assessment Note    Patient Details  Name: William Beasley MRN: [id number] Date of Birth: January 10, 1996  [facility]) CM/SW Contact:    Allayne Butcher, RN Phone Number: 03/14/2022, 2:24 PM  Clinical Narrative:                 Patient admitted for GI Bleeding, patient is now medically cleared for discharge home today.  RNCM was able to speak with patient via phone before discharge to offer substance abuse resources.  Patient is interested in quitting drinking and would like to be put on medication to help with the cravings.  Provide him with list of local facilities, outpatient provides that can help with substance abuse disorders, recommended that he start with [facility].  Also recommended he get set up with a PCP, provided him with the information on [facility], they usually have appointment availability within a week or two.    Patient is currently living with his mother, he no longer stays with his significant other and has a restraining order against him so he cannot return to that previous residence.   He is working in [employer].  All prescriptions have been send to [facility] on [address]  Patient's mother will be picking him up today.     Expected Discharge Plan: Home/Self Care Barriers to Discharge: Barriers Resolved   Patient Goals and CMS Choice Patient states their goals for this hospitalization and ongoing recovery are:: Glad to be going home, would like medicaiton to help him stop drinking      Expected Discharge Plan and Services Expected Discharge Plan: Home/Self Care   Discharge Planning Services: CM Consult   Living arrangements for the past 2 months: Single Family Home Expected Discharge Date: 03/14/22               DME Arranged: N/A DME Agency: NA       HH Arranged: NA HH Agency: NA        Prior Living Arrangements/Services Living arrangements for the past 2 months: Single Family Home Lives with::  Parents Patient language and need for interpreter reviewed:: Yes Do you feel safe going back to the place where you live?: Yes      Need for Family Participation in Patient Care: Yes (Comment) Care giver support system in place?: Yes (comment)   Criminal Activity/Legal Involvement Pertinent to Current Situation/Hospitalization: No - Comment as needed  Activities of Daily Living Home Assistive Devices/Equipment: None ADL Screening (condition at time of admission) Patient's cognitive ability adequate to safely complete daily activities?: Yes Is the patient deaf or have difficulty hearing?: No Does the patient have difficulty seeing, even when wearing glasses/contacts?: No Does the patient have difficulty concentrating, remembering, or making decisions?: No Patient able to express need for assistance with ADLs?: Yes Does the patient have difficulty dressing or bathing?: No Independently performs ADLs?: Yes (appropriate for developmental age) Does the patient have difficulty walking or climbing stairs?: No Weakness of Legs: None Weakness of Arms/Hands: None  Permission Sought/Granted                  Emotional Assessment   Attitude/Demeanor/Rapport: Engaged Affect (typically observed): Accepting Orientation: : Oriented to Self, Oriented to Place, Oriented to  Time, Oriented to Situation Alcohol / Substance Use: Alcohol Use, Illicit Drugs Psych Involvement: No (comment)  Admission diagnosis:  Acute GI bleeding [K92.2] Patient Active Problem List   Diagnosis Date Noted   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) [place] [address] Phone: [phone] Fax: [phone]  [facility] [address] [place] - [address] [place] [address] Phone:  [phone] Fax: [phone]     Social Determinants of Health (SDOH) Interventions    Readmission Risk Interventions     No data to display

## 2022-03-14 NOTE — Hospital Course (Addendum)
Taken from H&P.  26 y.o with significant PMH of substance induced mood disorder, suicide ideation, traumatic epidural hematoma, EtOH abuse and complicated alcohol withdrawal, Alcoholic Cirrhosis, hepatic and lung sarcoidosis, seizure like activity (12/2015), Gastritis below who presented to the ED with chief complaints of hematemesis   Patient was recently admitted to [facility] with hematemesis in the setting of newly diagnosed cirrhosis and Alcohol withdrawal. He was started on IV protonix, octreotide drip, IV ceftriaxone. Hemoglobin was closely monitored and remained stable. He underwent EGD on 1/17 that showed grade 1 esophageal varices and mild PHG. He was  discharged on carvedilol 6.25 mg BID and instructed to follow up with hepatology as outpatient. Since discharge, patient report that he has not used alcohol until yesterday morning. He report drinking a total of 3 beers to celebrate as she got his daughters custody back and woke up this morning with hematemesis.  UDS was positive for cocaine and cannabinoids. Initially admitted in ICU. GI was consulted and he was started on octreotide, Protonix and ceftriaxone.  EGD has been postponed until UDS becomes negative for cocaine.  8/10: Hemoglobin remained stable around baseline.  Repeat UDS this morning remain positive for cocaine, cannabinoid with added opioid and benzos which he received in hospital.  No more bleeding.  Most likely gastritis secondary to alcohol use. Patient was counseled again extensively against alcohol and illicit drug use. He was also advised to follow-up with [facility] hepatology and start workup on getting liver transplant. Patient need to take Protonix twice daily for the next 6 to 8 weeks.  Patient needed close follow-up with his primary care provider and gastroenterologist/hepatologist. Continue on current medications and follow-up with his providers.

## 2022-03-14 NOTE — Discharge Summary (Signed)
Physician Discharge Summary   Patient: William Beasley MRN: [id number] DOB: [birth year]  Admit date:     03/12/2022  Discharge date: 03/14/22  Discharge Physician: Arnetha Courser   PCP: Pcp, No   Recommendations at discharge:  Please obtain CBC and BMP in 1 week Follow-up with primary care provider within a week Follow-up with gastroenterology/hepatology.  Discharge Diagnoses: Principal Problem:   Acute GI bleeding Active Problems:   Cocaine poisoning (HCC)   Liver cirrhosis [facility])   Hospital Course: Taken from H&P.  26 y.o with significant PMH of substance induced mood disorder, suicide ideation, traumatic epidural hematoma, EtOH abuse and complicated alcohol withdrawal, Alcoholic Cirrhosis, hepatic and lung sarcoidosis, seizure like activity (12/2015), Gastritis below who presented to the ED with chief complaints of hematemesis   Patient was recently admitted to [facility] with hematemesis in the setting of newly diagnosed cirrhosis and Alcohol withdrawal. He was started on IV protonix, octreotide drip, IV ceftriaxone. Hemoglobin was closely monitored and remained stable. He underwent EGD on 1/17 that showed grade 1 esophageal varices and mild PHG. He was  discharged on carvedilol 6.25 mg BID and instructed to follow up with hepatology as outpatient. Since discharge, patient report that he has not used alcohol until yesterday morning. He report drinking a total of 3 beers to celebrate as she got his daughters custody back and woke up this morning with hematemesis.  UDS was positive for cocaine and cannabinoids. Initially admitted in ICU. GI was consulted and he was started on octreotide, Protonix and ceftriaxone.  EGD has been postponed until UDS becomes negative for cocaine.  8/10: Hemoglobin remained stable around baseline.  Repeat UDS this morning remain positive for cocaine, cannabinoid with added opioid and benzos which he received in hospital.  No more bleeding.  Most likely gastritis  secondary to alcohol use. Patient was counseled again extensively against alcohol and illicit drug use. He was also advised to follow-up with [facility] hepatology and start workup on getting liver transplant. Patient need to take Protonix twice daily for the next 6 to 8 weeks.  Patient needed close follow-up with his primary care provider and gastroenterologist/hepatologist. Continue on current medications and follow-up with his providers.   Consultants: Gastroenterology Procedures performed: None Disposition: Home Diet recommendation:  Discharge Diet Orders (From admission, onward)     Start     Ordered   03/14/22 0000  Diet - low sodium heart healthy        03/14/22 1246           Cardiac diet DISCHARGE MEDICATION: Allergies as of 03/14/2022   No Known Allergies      Medication List     STOP taking these medications    mupirocin ointment 2 % Commonly known as: BACTROBAN   traZODone 50 MG tablet Commonly known as: DESYREL       TAKE these medications    carvedilol 6.25 MG tablet Commonly known as: COREG Take 1 tablet (6.25 mg total) by mouth 2 (two) times daily.   fluticasone 50 MCG/ACT nasal spray Commonly known as: Flonase Place 1 spray into both nostrils daily.   folic acid 1 MG tablet Commonly known as: FOLVITE Take 1 tablet (1 mg total) by mouth daily.   pantoprazole 40 MG tablet Commonly known as: Protonix Take 1 tablet (40 mg total) by mouth 2 (two) times daily. What changed: when to take this   Thera-M Tabs Take 1 tablet by mouth daily.   thiamine 100 MG tablet Commonly known as:  VITAMIN B1 Take 1 tablet (100 mg total) by mouth daily.   traMADol 50 MG tablet Commonly known as: ULTRAM Take 1 tablet (50 mg total) by mouth every 6 (six) hours as needed.        Discharge Exam: Filed Weights   03/12/22 1553 03/13/22 0500 03/14/22 0429  Weight: 113.4 kg 113.3 kg 113.3 kg   General.     In no acute distress. Pulmonary.  Lungs clear  bilaterally, normal respiratory effort. CV.  Regular rate and rhythm, no JVD, rub or murmur. Abdomen.  Soft, nontender, nondistended, BS positive. CNS.  Alert and oriented .  No focal neurologic deficit. Extremities.  No edema, no cyanosis, pulses intact and symmetrical. Psychiatry.  Judgment and insight appears normal.   Condition at discharge: stable  The results of significant diagnostics from this hospitalization (including imaging, microbiology, ancillary and laboratory) are listed below for reference.   Imaging Studies: CT CHEST ABDOMEN PELVIS W CONTRAST  Result Date: 03/12/2022 CLINICAL DATA:  Esophageal varices  hx of alcoholic cirrhosis EXAM: CT CHEST, ABDOMEN, AND PELVIS WITH CONTRAST TECHNIQUE: Multidetector CT imaging of the chest, abdomen and pelvis was performed following the standard protocol during bolus administration of intravenous contrast. RADIATION DOSE REDUCTION: This exam was performed according to the departmental dose-optimization program which includes automated exposure control, adjustment of the mA and/or kV according to patient size and/or use of iterative reconstruction technique. CONTRAST:  OMNIPAQUE IOHEXOL 350 MG/ML SOLN COMPARISON:  01/18/2022 FINDINGS: CT CHEST FINDINGS Cardiovascular: Heart is normal size. Aorta is normal caliber. Mediastinum/Nodes: Mediastinal adenopathy. Right paratracheal lymph node has a short axis diameter of 17 mm. Subcarinal adenopathy has a short axis diameter of 20 mm. Bilateral hilar adenopathy. No axillary adenopathy. Trachea and thyroid unremarkable. Few distal esophageal varices noted. Lungs/Pleura: Lungs are clear. No focal airspace opacities or suspicious nodules. No effusions. Musculoskeletal: Mild bilateral gynecomastia. No acute bony abnormality. CT ABDOMEN PELVIS FINDINGS Hepatobiliary: Diffuse low-density throughout the liver compatible with fatty infiltration. Mildly nodular contours compatible with cirrhosis. No focal  hepatic abnormality. Gallbladder unremarkable. Pancreas: No focal abnormality or ductal dilatation. Spleen: Insert splenomegaly with craniocaudal length 15.7 cm. Adrenals/Urinary Tract: No adrenal abnormality. No focal renal abnormality. No stones or hydronephrosis. Urinary bladder is unremarkable. Stomach/Bowel: Normal appendix. Stomach, large and small bowel grossly unremarkable. Vascular/Lymphatic: No evidence of aneurysm or adenopathy. Upper abdominal and distal esophageal varices noted compatible with portal venous hypertension. Reproductive: No visible focal abnormality. Other: No free fluid or free air. Musculoskeletal: No acute bony abnormality. IMPRESSION: Changes of cirrhosis and fatty infiltration of the liver. Associated splenomegaly. Upper abdominal and distal esophageal varices compatible with portal venous hypertension. Mediastinal and bilateral hilar adenopathy. Mild bilateral gynecomastia. Electronically Signed   By: Charlett Nose M.D.   On: 03/12/2022 22:28   DG Chest 2 View  Result Date: 03/12/2022 CLINICAL DATA:  Right-sided abdominal pain and vomiting since this morning. Cirrhosis diagnosed 6 months ago. EXAM: CHEST - 2 VIEW COMPARISON:  06/02/2011 FINDINGS: Lungs are adequately inflated without focal airspace consolidation or effusion. Heart size is normal. Prominence of the hilar regions bilaterally. Remainder of the exam is unchanged. IMPRESSION: 1. No acute cardiopulmonary disease. 2. Prominence of the hilar regions bilaterally suggesting adenopathy. Recommend contrast-enhanced chest CT on elective basis for further evaluation. Electronically Signed   By: Elberta Fortis M.D.   On: 03/12/2022 18:33    Microbiology: Results for orders placed or performed during the hospital encounter of 03/12/22  MRSA Next Gen by PCR, Nasal  Status: None   Collection Time: 03/12/22  8:56 PM   Specimen: Nasal Mucosa; Nasal Swab  Result Value Ref Range Status   MRSA by PCR Next Gen NOT DETECTED NOT  DETECTED Final    Comment: (NOTE) The GeneXpert MRSA Assay (FDA approved for NASAL specimens only), is one component of a comprehensive MRSA colonization surveillance program. It is not intended to diagnose MRSA infection nor to guide or monitor treatment for MRSA infections. Test performance is not FDA approved in patients less than 51 years old. Performed at [facility], [address] [place], [place] [address]   Culture, blood (Routine X 2) w Reflex to ID Panel     Status: None (Preliminary result)   Collection Time: 03/13/22  2:08 AM   Specimen: BLOOD  Result Value Ref Range Status   Specimen Description BLOOD LEFT HAND  Final   Special Requests   Final    BOTTLES DRAWN AEROBIC AND ANAEROBIC Blood Culture adequate volume   Culture   Final    NO GROWTH 1 DAY Performed at [facility], [address] [place], [place] [address]    Report Status PENDING  Incomplete  Culture, blood (Routine X 2) w Reflex to ID Panel     Status: None (Preliminary result)   Collection Time: 03/13/22  2:08 AM   Specimen: BLOOD  Result Value Ref Range Status   Specimen Description BLOOD LEFT FOREARM  Final   Special Requests   Final    IN PEDIATRIC BOTTLE Blood Culture results may not be optimal due to an excessive volume of blood received in culture bottles   Culture   Final    NO GROWTH 1 DAY Performed at [facility], [address] [place], [place] [address]    Report Status PENDING  Incomplete    Labs: CBC: Recent Labs  Lab 03/12/22 1556 03/12/22 2357 03/13/22 0208 03/13/22 1159 03/13/22 1820 03/14/22 0001 03/14/22 0621  WBC 4.3  --  3.6*  --   --   --  2.6*  NEUTROABS  --   --   --   --   --   --  1.4*  HGB 15.6   < > 14.2 15.0 14.7 14.3 14.3  HCT 45.8   < > 41.8 43.2 42.6 42.6 41.4  MCV 94.0  --  93.9  --   --   --  94.3  PLT 71*  --  50*  --   --   --  43*   < > = values in this interval not displayed.   Basic Metabolic Panel: Recent Labs   Lab 03/12/22 1556 03/13/22 0208 03/14/22 0621  NA 135 137 136  K 3.5 3.6 3.6  CL 101 102 104  CO2 22 26 26   GLUCOSE 140* 126* 103*  BUN <5* <5* 6  CREATININE 0.57* 0.51* 0.62  CALCIUM 9.2 8.9 9.1  MG  --  1.6* 2.0  PHOS  --  3.5 3.9   Liver Function Tests: Recent Labs  Lab 03/12/22 1556  AST 74*  ALT 43  ALKPHOS 118  BILITOT 1.7*  PROT 8.7*  ALBUMIN 4.2   CBG: Recent Labs  Lab 03/12/22 2113  GLUCAP 161*    Discharge time spent: greater than 30 minutes.  This record has been created using 2114. Errors have been sought and corrected,but may not always be located. Such creation errors do not reflect on the standard of care.   Signed: Conservation officer, historic buildings  Nelson Chimes, MD [facility] 03/14/2022

## 2022-03-14 NOTE — Discharge Instructions (Signed)
   Substance Use/Abuse Treatment Resources   [facility] [address] [place], [place] [address] Phone number: [phone] Fax number: [phone] Walk-in Clinic: Monday- Friday 8:00 AM-8:00 PM (Crisis Individuals) Monday, Wednesday. Friday: 8:00 AM-3:30 PM (For assessments) Hospital Follow-Up (Schedule appointments)  [facility] [address] [place], [place] [address] Phone number: [phone] Fax number: [phone]  [facility] [address] [place], [place] [address] Phone Number: [phone] bn Walk-in-Clinic: Monday- Friday 9:00 AM-4:00 PM  [facility] [address] [place], [place] [address] Phone number: [phone]  [facility] (Alcohol Treatment Program in [place]) [address] [place], [place] [address] Phone number: [phone] Fax number: [phone]  [facility] (Alcohol[facility] Treatment Program in [place]) [address] [place] Office) [place], [place] [address] Phone number: [phone]  [address] (Detox Facilities) [place], [place] [address] Phone number: [phone] Fax number: [phone]  [facility] (Addiction Treatment Center in [place]) [address] [place], [place] [address] Phone number: [phone]  [facility] - telehealth [address],  [place], [place] [address]  Phone number: [phone]

## 2022-03-14 NOTE — Progress Notes (Signed)
William Repress, MD [address]  [address]  [place], [place] [address]  Main: [phone]  Fax: [phone] Pager: [phone]   Subjective: No acute events overnight.  Patient denies any abdominal pain.  He denies any further episodes of hematemesis since admission.  He denies any bowel movement yet.   Objective: Vital signs in last 24 hours: Vitals:   03/14/22 0800 03/14/22 0900 03/14/22 1000 03/14/22 1200  BP: 121/69 121/75 126/86 126/67  Pulse: (!) 58 (!) 58 64 68  Resp: 15 15 17  (!) 23  Temp: 98.4 F (36.9 C)   98.6 F (37 C)  TempSrc: Oral   Oral  SpO2: 94% 95% 93% 93%  Weight:      Height:       Weight change: -0.099 kg  Intake/Output Summary (Last 24 hours) at 03/14/2022 1327 Last data filed at 03/14/2022 1200 Gross per 24 hour  Intake 897.83 ml  Output 450 ml  Net 447.83 ml     Exam: Heart:: Regular rate and rhythm, S1S2 present, or without murmur or extra heart sounds Lungs: normal and clear to auscultation Abdomen: soft, nontender, normal bowel sounds   Lab Results:    Latest Ref Rng & Units 03/14/2022    6:21 AM 03/14/2022   12:01 AM 03/13/2022    6:20 PM  CBC  WBC 4.0 - 10.5 K/uL 2.6     Hemoglobin 13.0 - 17.0 g/dL 05/13/2022  51.0  25.8   Hematocrit 39.0 - 52.0 % 41.4  42.6  42.6   Platelets 150 - 400 K/uL 43         Latest Ref Rng & Units 03/14/2022    6:21 AM 03/13/2022    2:08 AM 03/12/2022    3:56 PM  CMP  Glucose 70 - 99 mg/dL 05/12/2022  782  423   BUN 6 - 20 mg/dL 6  <5  <5   Creatinine 0.61 - 1.24 mg/dL 536  1.44  3.15   Sodium 135 - 145 mmol/L 136  137  135   Potassium 3.5 - 5.1 mmol/L 3.6  3.6  3.5   Chloride 98 - 111 mmol/L 104  102  101   CO2 22 - 32 mmol/L 26  26  22    Calcium 8.9 - 10.3 mg/dL 9.1  8.9  9.2   Total Protein 6.5 - 8.1 g/dL   8.7   Total Bilirubin 0.3 - 1.2 mg/dL   1.7   Alkaline Phos 38 - 126 U/L   118   AST 15 - 41 U/L   74   ALT 0 - 44 U/L   43     Micro Results: Recent Results (from the past 240 hour(s))   MRSA Next Gen by PCR, Nasal     Status: None   Collection Time: 03/12/22  8:56 PM   Specimen: Nasal Mucosa; Nasal Swab  Result Value Ref Range Status   MRSA by PCR Next Gen NOT DETECTED NOT DETECTED Final    Comment: (NOTE) The GeneXpert MRSA Assay (FDA approved for NASAL specimens only), is one component of a comprehensive MRSA colonization surveillance program. It is not intended to diagnose MRSA infection nor to guide or monitor treatment for MRSA infections. Test performance is not FDA approved in patients less than 25 years old. Performed at [facility], [address] [place], [address] [place]   Culture, blood (Routine X 2) w Reflex to ID Panel     Status: None (Preliminary result)  Collection Time: 03/13/22  2:08 AM   Specimen: BLOOD  Result Value Ref Range Status   Specimen Description BLOOD LEFT HAND  Final   Special Requests   Final    BOTTLES DRAWN AEROBIC AND ANAEROBIC Blood Culture adequate volume   Culture   Final    NO GROWTH 1 DAY Performed at [facility], [address] [place], [place] [address]    Report Status PENDING  Incomplete  Culture, blood (Routine X 2) w Reflex to ID Panel     Status: None (Preliminary result)   Collection Time: 03/13/22  2:08 AM   Specimen: BLOOD  Result Value Ref Range Status   Specimen Description BLOOD LEFT FOREARM  Final   Special Requests   Final    IN PEDIATRIC BOTTLE Blood Culture results may not be optimal due to an excessive volume of blood received in culture bottles   Culture   Final    NO GROWTH 1 DAY Performed at [facility], [address] [place], [place] [address]    Report Status PENDING  Incomplete   Studies/Results: CT CHEST ABDOMEN PELVIS W CONTRAST  Result Date: 03/12/2022 CLINICAL DATA:  Esophageal varices  hx of alcoholic cirrhosis EXAM: CT CHEST, ABDOMEN, AND PELVIS WITH CONTRAST TECHNIQUE: Multidetector CT imaging of the chest, abdomen and pelvis was performed  following the standard protocol during bolus administration of intravenous contrast. RADIATION DOSE REDUCTION: This exam was performed according to the departmental dose-optimization program which includes automated exposure control, adjustment of the mA and/or kV according to patient size and/or use of iterative reconstruction technique. CONTRAST:  OMNIPAQUE IOHEXOL 350 MG/ML SOLN COMPARISON:  01/18/2022 FINDINGS: CT CHEST FINDINGS Cardiovascular: Heart is normal size. Aorta is normal caliber. Mediastinum/Nodes: Mediastinal adenopathy. Right paratracheal lymph node has a short axis diameter of 17 mm. Subcarinal adenopathy has a short axis diameter of 20 mm. Bilateral hilar adenopathy. No axillary adenopathy. Trachea and thyroid unremarkable. Few distal esophageal varices noted. Lungs/Pleura: Lungs are clear. No focal airspace opacities or suspicious nodules. No effusions. Musculoskeletal: Mild bilateral gynecomastia. No acute bony abnormality. CT ABDOMEN PELVIS FINDINGS Hepatobiliary: Diffuse low-density throughout the liver compatible with fatty infiltration. Mildly nodular contours compatible with cirrhosis. No focal hepatic abnormality. Gallbladder unremarkable. Pancreas: No focal abnormality or ductal dilatation. Spleen: Insert splenomegaly with craniocaudal length 15.7 cm. Adrenals/Urinary Tract: No adrenal abnormality. No focal renal abnormality. No stones or hydronephrosis. Urinary bladder is unremarkable. Stomach/Bowel: Normal appendix. Stomach, large and small bowel grossly unremarkable. Vascular/Lymphatic: No evidence of aneurysm or adenopathy. Upper abdominal and distal esophageal varices noted compatible with portal venous hypertension. Reproductive: No visible focal abnormality. Other: No free fluid or free air. Musculoskeletal: No acute bony abnormality. IMPRESSION: Changes of cirrhosis and fatty infiltration of the liver. Associated splenomegaly. Upper abdominal and distal esophageal varices  compatible with portal venous hypertension. Mediastinal and bilateral hilar adenopathy. Mild bilateral gynecomastia. Electronically Signed   By: Charlett Nose M.D.   On: 03/12/2022 22:28   DG Chest 2 View  Result Date: 03/12/2022 CLINICAL DATA:  Right-sided abdominal pain and vomiting since this morning. Cirrhosis diagnosed 6 months ago. EXAM: CHEST - 2 VIEW COMPARISON:  06/02/2011 FINDINGS: Lungs are adequately inflated without focal airspace consolidation or effusion. Heart size is normal. Prominence of the hilar regions bilaterally. Remainder of the exam is unchanged. IMPRESSION: 1. No acute cardiopulmonary disease. 2. Prominence of the hilar regions bilaterally suggesting adenopathy. Recommend contrast-enhanced chest CT on elective basis for further evaluation. Electronically Signed  By: Elberta Fortis M.D.   On: 03/12/2022 18:33   Medications: I have reviewed the patient's current medications. Prior to Admission:  Medications Prior to Admission  Medication Sig Dispense Refill Last Dose   carvedilol (COREG) 6.25 MG tablet Take 1 tablet (6.25 mg total) by mouth 2 (two) times daily. 60 tablet 1 Past Week   folic acid (FOLVITE) 1 MG tablet Take 1 tablet (1 mg total) by mouth daily. 30 tablet 1 Past Week   traMADol (ULTRAM) 50 MG tablet Take 1 tablet (50 mg total) by mouth every 6 (six) hours as needed. 15 tablet 0 Past Month   fluticasone (FLONASE) 50 MCG/ACT nasal spray Place 1 spray into both nostrils daily. 1 g 0    Multiple Vitamins-Minerals (THERA-M) TABS Take 1 tablet by mouth daily.      mupirocin ointment (BACTROBAN) 2 % Apply 1 application topically 3 (three) times daily. (Patient not taking: Reported on 03/13/2022) 22 g 0 Not Taking   thiamine (VITAMIN B-1) 100 MG tablet Take 1 tablet (100 mg total) by mouth daily. (Patient not taking: Reported on 03/13/2022) 30 tablet 1 Not Taking   traZODone (DESYREL) 50 MG tablet Take 25 mg by mouth at bedtime as needed. (Patient not taking: Reported on  03/13/2022)   Not Taking   [DISCONTINUED] pantoprazole (PROTONIX) 40 MG tablet Take 1 tablet (40 mg total) by mouth daily. 30 tablet 0    Scheduled:  Chlorhexidine Gluconate Cloth  6 each Topical Daily   feeding supplement  1 Container Oral TID BM   folic acid  1 mg Intravenous Daily   multivitamin with minerals  1 tablet Oral Daily   thiamine  100 mg Intravenous Daily   Continuous:  cefTRIAXone (ROCEPHIN)  IV Stopped (03/13/22 1741)   octreotide (SANDOSTATIN) 500 mcg in sodium chloride 0.9 % 250 mL (2 mcg/mL) infusion 50 mcg/hr (03/14/22 1229)   pantoprazole 8 mg/hr (03/14/22 1200)   WUX:[id number] sodium, hydrALAZINE, LORazepam **OR** LORazepam, ondansetron (ZOFRAN) IV, polyethylene glycol, traZODone Anti-infectives (From admission, onward)    Start     Dose/Rate Route Frequency Ordered Stop   03/13/22 1800  cefTRIAXone (ROCEPHIN) 1 g in sodium chloride 0.9 % 100 mL IVPB  Status:  Discontinued        1 g 200 mL/hr over 30 Minutes Intravenous  Once 03/13/22 0142 03/13/22 1003   03/13/22 1800  cefTRIAXone (ROCEPHIN) 1 g in sodium chloride 0.9 % 100 mL IVPB        1 g 200 mL/hr over 30 Minutes Intravenous Every 24 hours 03/13/22 1003     03/12/22 1745  cefTRIAXone (ROCEPHIN) 1 g in sodium chloride 0.9 % 100 mL IVPB        1 g 200 mL/hr over 30 Minutes Intravenous  Once 03/12/22 1740 03/12/22 1855      Scheduled Meds:  Chlorhexidine Gluconate Cloth  6 each Topical Daily   feeding supplement  1 Container Oral TID BM   folic acid  1 mg Intravenous Daily   multivitamin with minerals  1 tablet Oral Daily   thiamine  100 mg Intravenous Daily   Continuous Infusions:  cefTRIAXone (ROCEPHIN)  IV Stopped (03/13/22 1741)   octreotide (SANDOSTATIN) 500 mcg in sodium chloride 0.9 % 250 mL (2 mcg/mL) infusion 50 mcg/hr (03/14/22 1229)   pantoprazole 8 mg/hr (03/14/22 1200)   PRN Meds:.docusate sodium, hydrALAZINE, LORazepam **OR** LORazepam, ondansetron (ZOFRAN) IV, polyethylene glycol,  traZODone   Assessment: Principal Problem:   Acute GI bleeding Active Problems:  Cocaine poisoning (HCC)   Liver cirrhosis (HCC) William Beasley is a 26 y.o. male with pulmonary sarcoidosis, hepatic sarcoidosis, history of alcohol abuse, cirrhosis of liver with esophageal varices presented with hematemesis   Plan: Hematemesis: No active bleeding since admission Hemoglobin has been stable and within normal limits Patient's urine drug screen came back positive for cocaine today.  With no active bleeding and stable hemodynamics, I do not recommend any urgent or emergent upper endoscopy at this time Advised patient to follow-up with his transplant hepatologist Strongly advised patient to stop drinking alcohol and avoid substance use Okay to discontinue Octreotide drip and Protonix drip Recommend Protonix 40 mg p.o. twice daily Restart Coreg 6.25 milligrams twice daily Continue multivitamin, thiamine and folic acid daily Patient can be discharged home today   LOS: 2 days   Alex Mcmanigal 03/14/2022, 1:27 PM

## 2022-03-18 LAB — CULTURE, BLOOD (ROUTINE X 2)
Culture: NO GROWTH
Culture: NO GROWTH
Special Requests: ADEQUATE

## 2022-04-12 DIAGNOSIS — D8689 Sarcoidosis of other sites: Secondary | ICD-10-CM | POA: Insufficient documentation

## 2022-04-12 DIAGNOSIS — D862 Sarcoidosis of lung with sarcoidosis of lymph nodes: Secondary | ICD-10-CM | POA: Insufficient documentation

## 2022-11-02 ENCOUNTER — Inpatient Hospital Stay
Admission: EM | Admit: 2022-11-02 | Discharge: 2022-11-06 | DRG: 392 | Disposition: A | Discharge status: Home / Self Care | Payer: 59 | Attending: Internal Medicine | Admitting: Internal Medicine

## 2022-11-02 ENCOUNTER — Emergency Department: Payer: 59

## 2022-11-02 ENCOUNTER — Other Ambulatory Visit: Payer: Self-pay

## 2022-11-02 DIAGNOSIS — F10239 Alcohol dependence with withdrawal, unspecified: Secondary | ICD-10-CM | POA: Diagnosis present

## 2022-11-02 DIAGNOSIS — F10229 Alcohol dependence with intoxication, unspecified: Secondary | ICD-10-CM | POA: Diagnosis present

## 2022-11-02 DIAGNOSIS — F10920 Alcohol use, unspecified with intoxication, uncomplicated: Secondary | ICD-10-CM

## 2022-11-02 DIAGNOSIS — F1010 Alcohol abuse, uncomplicated: Secondary | ICD-10-CM | POA: Diagnosis present

## 2022-11-02 DIAGNOSIS — D869 Sarcoidosis, unspecified: Secondary | ICD-10-CM | POA: Diagnosis present

## 2022-11-02 DIAGNOSIS — D696 Thrombocytopenia, unspecified: Secondary | ICD-10-CM | POA: Diagnosis present

## 2022-11-02 DIAGNOSIS — K2920 Alcoholic gastritis without bleeding: Secondary | ICD-10-CM | POA: Insufficient documentation

## 2022-11-02 DIAGNOSIS — M79674 Pain in right toe(s): Secondary | ICD-10-CM | POA: Diagnosis present

## 2022-11-02 DIAGNOSIS — R1013 Epigastric pain: Principal | ICD-10-CM

## 2022-11-02 DIAGNOSIS — Z6836 Body mass index (BMI) 36.0-36.9, adult: Secondary | ICD-10-CM

## 2022-11-02 DIAGNOSIS — F10930 Alcohol use, unspecified with withdrawal, uncomplicated: Secondary | ICD-10-CM

## 2022-11-02 DIAGNOSIS — E669 Obesity, unspecified: Secondary | ICD-10-CM | POA: Diagnosis present

## 2022-11-02 DIAGNOSIS — Z23 Encounter for immunization: Secondary | ICD-10-CM

## 2022-11-02 DIAGNOSIS — T405X1A Poisoning by cocaine, accidental (unintentional), initial encounter: Secondary | ICD-10-CM | POA: Diagnosis present

## 2022-11-02 DIAGNOSIS — K766 Portal hypertension: Secondary | ICD-10-CM | POA: Diagnosis present

## 2022-11-02 DIAGNOSIS — R000 Tachycardia, unspecified: Secondary | ICD-10-CM | POA: Diagnosis present

## 2022-11-02 DIAGNOSIS — K7460 Unspecified cirrhosis of liver: Secondary | ICD-10-CM | POA: Diagnosis present

## 2022-11-02 DIAGNOSIS — R112 Nausea with vomiting, unspecified: Secondary | ICD-10-CM

## 2022-11-02 DIAGNOSIS — Z79899 Other long term (current) drug therapy: Secondary | ICD-10-CM

## 2022-11-02 DIAGNOSIS — Y908 Blood alcohol level of 240 mg/100 ml or more: Secondary | ICD-10-CM | POA: Diagnosis present

## 2022-11-02 DIAGNOSIS — F10220 Alcohol dependence with intoxication, uncomplicated: Secondary | ICD-10-CM

## 2022-11-02 LAB — ETHANOL: Alcohol, Ethyl (B): 293 mg/dL — ABNORMAL HIGH (ref <10)

## 2022-11-02 LAB — URINALYSIS, COMPLETE (UACMP) WITH MICROSCOPIC
Bilirubin Urine: NEGATIVE
Glucose, UA: NEGATIVE mg/dL
Hgb urine dipstick: NEGATIVE
Ketones, ur: NEGATIVE mg/dL
Leukocytes,Ua: NEGATIVE
Nitrite: NEGATIVE
Protein, ur: NEGATIVE mg/dL
Specific Gravity, Urine: 1.001 — ABNORMAL LOW (ref 1.005–1.030)
pH: 7 (ref 5.0–8.0)

## 2022-11-02 LAB — COMPREHENSIVE METABOLIC PANEL
ALT: 67 U/L — ABNORMAL HIGH (ref 0–44)
AST: 86 U/L — ABNORMAL HIGH (ref 15–41)
Albumin: 4.3 g/dL (ref 3.5–5.0)
Alkaline Phosphatase: 105 U/L (ref 38–126)
Anion gap: 12 (ref 5–15)
BUN: <5 mg/dL — ABNORMAL LOW (ref 6–20)
CO2: 24 mmol/L (ref 22–32)
Calcium: 8.5 mg/dL — ABNORMAL LOW (ref 8.9–10.3)
Chloride: 97 mmol/L — ABNORMAL LOW (ref 98–111)
Creatinine, Ser: 0.51 mg/dL — ABNORMAL LOW (ref 0.61–1.24)
GFR, Estimated: >60 mL/min (ref >60)
Glucose, Bld: 183 mg/dL — ABNORMAL HIGH (ref 70–99)
Potassium: 3.8 mmol/L (ref 3.5–5.1)
Sodium: 133 mmol/L — ABNORMAL LOW (ref 135–145)
Total Bilirubin: 1.1 mg/dL (ref 0.3–1.2)
Total Protein: 9 g/dL — ABNORMAL HIGH (ref 6.5–8.1)

## 2022-11-02 LAB — TYPE AND SCREEN
ABO/RH(D): A POS
Antibody Screen: NEGATIVE

## 2022-11-02 LAB — CBC
HCT: 43.5 % (ref 39.0–52.0)
Hemoglobin: 15.3 g/dL (ref 13.0–17.0)
MCH: 31.2 pg (ref 26.0–34.0)
MCHC: 35.2 g/dL (ref 30.0–36.0)
MCV: 88.6 fL (ref 80.0–100.0)
Platelets: 89 10*3/uL — ABNORMAL LOW (ref 150–400)
RBC: 4.91 MIL/uL (ref 4.22–5.81)
RDW: 13.4 % (ref 11.5–15.5)
WBC: 5.7 10*3/uL (ref 4.0–10.5)
nRBC: 0 % (ref 0.0–0.2)

## 2022-11-02 LAB — AMMONIA: Ammonia: 52 umol/L — ABNORMAL HIGH (ref 9–35)

## 2022-11-02 LAB — LIPASE, BLOOD: Lipase: 58 U/L — ABNORMAL HIGH (ref 11–51)

## 2022-11-02 MED ORDER — IOHEXOL 350 MG/ML SOLN
100.0000 mL | Freq: Once | INTRAVENOUS | Status: AC | PRN
  Administered 2022-11-02: 100 mL via INTRAVENOUS

## 2022-11-02 MED ORDER — MORPHINE SULFATE (PF) 4 MG/ML IV SOLN
4.0000 mg | Freq: Once | INTRAVENOUS | Status: AC
  Administered 2022-11-02: 4 mg via INTRAVENOUS
  Filled 2022-11-02: qty 1

## 2022-11-02 MED ORDER — LORAZEPAM 2 MG/ML IJ SOLN
1.0000 mg | Freq: Once | INTRAMUSCULAR | Status: AC
  Administered 2022-11-02: 1 mg via INTRAVENOUS
  Filled 2022-11-02: qty 1

## 2022-11-02 MED ORDER — PANTOPRAZOLE SODIUM 40 MG IV SOLR
40.0000 mg | Freq: Once | INTRAVENOUS | Status: AC
  Administered 2022-11-02: 40 mg via INTRAVENOUS
  Filled 2022-11-02: qty 10

## 2022-11-02 MED ORDER — SODIUM CHLORIDE 0.9 % IV BOLUS
1000.0000 mL | Freq: Once | INTRAVENOUS | Status: AC
  Administered 2022-11-02: 1000 mL via INTRAVENOUS

## 2022-11-02 MED ORDER — ONDANSETRON HCL 4 MG/2ML IJ SOLN
4.0000 mg | Freq: Once | INTRAMUSCULAR | Status: AC
  Administered 2022-11-02: 4 mg via INTRAVENOUS
  Filled 2022-11-02: qty 2

## 2022-11-02 NOTE — ED Triage Notes (Addendum)
Pt arrives via POV with CC of vomiting that began this am.

## 2022-11-02 NOTE — ED Notes (Addendum)
This RN was assisting during triage. Pt is here for vomiting blood, states he has cirrhosis of the liver. Pt smells heavily of ETOH. Pt states he has been drinking all day and his liver hurts. Pt proceeds to have a syncopal episode in triage. RN unable to stimulate him with pain for about 15-20 seconds. Pt then awake and was assisted to a wheelchair and moved to a room.

## 2022-11-02 NOTE — ED Notes (Signed)
Pt states he has been vomiting since this morning. Pt had syncopal episode in triage. Pt has cirrhosis of the liver he stated since he got that diagnosis he has stopped drinking heavily but his baby mama left and took his child with her and has been in a downward spiral the past week  and has been drinking nonstop.

## 2022-11-02 NOTE — ED Provider Notes (Signed)
[facility] Provider Note    Event Date/Time   First MD Initiated Contact with Patient 11/02/22 2136     (approximate)  History   Chief Complaint: Emesis  HPI  William Beasley is a 27 y.o. male with a past medical history of alcoholism and cirrhosis presents to the emergency department for right upper quadrant abdominal pain vomiting and blood in the vomitus.  According to the patient for the last few days he has been experiencing worsening pain in the upper abdomen mostly in the right upper quadrant.  States he has been nauseated and vomited saw small amount of blood within the vomit so he came to the emergency department.  Patient states this has happened previously he was prescribed Protonix but states he is not taking that medication.  Patient does admit to recent alcohol use including tonight.  Physical Exam   Triage Vital Signs: ED Triage Vitals  Enc Vitals Group     BP 11/02/22 2130 (!) 150/100     Pulse Rate 11/02/22 2130 (!) 120     Resp 11/02/22 2130 (!) 22     Temp 11/02/22 2130 98 F (36.7 C)     Temp Source 11/02/22 2130 Oral     SpO2 11/02/22 2130 96 %     Weight 11/02/22 2128 256 lb 6.3 oz (116.3 kg)     Height 11/02/22 2128 5\' 9"  (1.753 m)     Head Circumference --      Peak Flow --      Pain Score 11/02/22 2128 10     Pain Loc --      Pain Edu? --      Excl. in [place]? --     Most recent vital signs: Vitals:   11/02/22 2130  BP: (!) 150/100  Pulse: (!) 120  Resp: (!) 22  Temp: 98 F (36.7 C)  SpO2: 96%    General: Awake, no distress.  CV:  Good peripheral perfusion.  Regular rate and rhythm  Resp:  Normal effort.  Equal breath sounds bilaterally.  Abd:  No distention.  Soft, mild right upper quadrant/epigastric abdominal tenderness.  No rebound or guarding.  ED Results / Procedures / Treatments   RADIOLOGY  CT pending   MEDICATIONS ORDERED IN ED: Medications  morphine (PF) 4 MG/ML injection 4 mg (has no  administration in time range)  ondansetron (ZOFRAN) injection 4 mg (has no administration in time range)  sodium chloride 0.9 % bolus 1,000 mL (has no administration in time range)  pantoprazole (PROTONIX) injection 40 mg (has no administration in time range)     IMPRESSION / MDM / ASSESSMENT AND PLAN / ED COURSE  I reviewed the triage vital signs and the nursing notes.  Patient's presentation is most consistent with acute presentation with potential threat to life or bodily function.  Patient presents emergency department for upper abdominal pain nausea vomiting and noted some blood within the vomitus.  Patient states he was a small amount of blood within the vomit.  Patient does have VitaCarn abdominal pain which he states is the main reason he came tonight.  He states this is fairly frequent for him as well due to his cirrhosis per patient.  We will check labs including a type and screen.  We will IV hydrate dose IV Protonix pain and nausea medication.  Will continue to closely monitor while awaiting further results.  Patient's labs have resulted showing normal urinalysis, ammonia level of 52.  CBC  is reassuring with a normal H&H, mild thrombocytopenia.  Patient's chemistry shows mild LFT elevation likely due to chronic alcoholism no other significant findings.  Alcohol level is elevated 293.  Patient is also having some blood in the right nostril/epistaxis, this very likely could be the cause of the blood that the patient noted in his vomitus earlier tonight.  Patient continues to have abdominal pain despite pain medication.  Will dose a one-time dose of Ativan as well to help with the patient's nausea.  We will continue to closely monitor.    Discussed results with the patient.  Lipase is pending.  Will also obtain a CT scan of the abdomen/pelvis to further evaluate.  Patient agreeable to plan of care.  Patient care signed out to oncoming provider.  FINAL CLINICAL IMPRESSION(S) / ED DIAGNOSES    Upper abdominal pain Nausea vomiting   Note:  This document was prepared using Dragon voice recognition software and may include unintentional dictation errors.   Harvest Dark, MD 11/02/22 [phone]

## 2022-11-03 DIAGNOSIS — K7460 Unspecified cirrhosis of liver: Secondary | ICD-10-CM

## 2022-11-03 DIAGNOSIS — F1410 Cocaine abuse, uncomplicated: Secondary | ICD-10-CM

## 2022-11-03 DIAGNOSIS — D696 Thrombocytopenia, unspecified: Secondary | ICD-10-CM

## 2022-11-03 DIAGNOSIS — F1010 Alcohol abuse, uncomplicated: Secondary | ICD-10-CM

## 2022-11-03 DIAGNOSIS — K2920 Alcoholic gastritis without bleeding: Secondary | ICD-10-CM | POA: Insufficient documentation

## 2022-11-03 DIAGNOSIS — F10239 Alcohol dependence with withdrawal, unspecified: Secondary | ICD-10-CM | POA: Diagnosis present

## 2022-11-03 LAB — URINE DRUG SCREEN, QUALITATIVE (ARMC ONLY)
Amphetamines, Ur Screen: NOT DETECTED
Barbiturates, Ur Screen: NOT DETECTED
Benzodiazepine, Ur Scrn: POSITIVE — AB
Cannabinoid 50 Ng, Ur ~~LOC~~: POSITIVE — AB
Cocaine Metabolite,Ur ~~LOC~~: NOT DETECTED
MDMA (Ecstasy)Ur Screen: NOT DETECTED
Methadone Scn, Ur: NOT DETECTED
Opiate, Ur Screen: NOT DETECTED
Phencyclidine (PCP) Ur S: NOT DETECTED
Tricyclic, Ur Screen: NOT DETECTED

## 2022-11-03 LAB — PROTIME-INR
INR: 1.3 — ABNORMAL HIGH (ref 0.8–1.2)
Prothrombin Time: 15.9 seconds — ABNORMAL HIGH (ref 11.4–15.2)

## 2022-11-03 MED ORDER — PANTOPRAZOLE SODIUM 40 MG IV SOLR
40.0000 mg | Freq: Two times a day (BID) | INTRAVENOUS | Status: DC
  Administered 2022-11-03 – 2022-11-05 (×6): 40 mg via INTRAVENOUS
  Filled 2022-11-03 (×5): qty 10

## 2022-11-03 MED ORDER — KETOROLAC TROMETHAMINE 30 MG/ML IJ SOLN
30.0000 mg | Freq: Once | INTRAMUSCULAR | Status: AC
  Administered 2022-11-03: 30 mg via INTRAVENOUS
  Filled 2022-11-03: qty 1

## 2022-11-03 MED ORDER — SODIUM CHLORIDE 0.9 % IV SOLN
12.5000 mg | Freq: Once | INTRAVENOUS | Status: AC
  Administered 2022-11-03: 12.5 mg via INTRAVENOUS
  Filled 2022-11-03: qty 12.5

## 2022-11-03 MED ORDER — ONDANSETRON 4 MG PO TBDP: 4.0000 mg | ORAL_TABLET | Freq: Three times a day (TID) | ORAL | 0 refills | Status: DC | PRN

## 2022-11-03 MED ORDER — ONDANSETRON HCL 4 MG/2ML IJ SOLN
4.0000 mg | Freq: Four times a day (QID) | INTRAMUSCULAR | Status: DC | PRN
  Administered 2022-11-03 – 2022-11-05 (×8): 4 mg via INTRAVENOUS
  Filled 2022-11-03 (×8): qty 2

## 2022-11-03 MED ORDER — METOCLOPRAMIDE HCL 5 MG/ML IJ SOLN
10.0000 mg | Freq: Once | INTRAMUSCULAR | Status: AC
  Administered 2022-11-03: 10 mg via INTRAVENOUS
  Filled 2022-11-03: qty 2

## 2022-11-03 MED ORDER — INFLUENZA VAC SPLIT QUAD 0.5 ML IM SUSY: 0.5000 mL | PREFILLED_SYRINGE | INTRAMUSCULAR | Status: DC

## 2022-11-03 MED ORDER — SODIUM CHLORIDE 0.9 % IV BOLUS
1000.0000 mL | Freq: Once | INTRAVENOUS | Status: AC
  Administered 2022-11-03: 1000 mL via INTRAVENOUS

## 2022-11-03 MED ORDER — THIAMINE HCL 100 MG PO TABS
100.0000 mg | ORAL_TABLET | Freq: Every day | ORAL | Status: DC
  Administered 2022-11-03 – 2022-11-06 (×4): 100 mg via ORAL
  Filled 2022-11-03 (×8): qty 1

## 2022-11-03 MED ORDER — OXYCODONE-ACETAMINOPHEN 5-325 MG PO TABS
1.0000 | ORAL_TABLET | Freq: Once | ORAL | Status: AC
  Administered 2022-11-03: 1 via ORAL
  Filled 2022-11-03: qty 1

## 2022-11-03 MED ORDER — THIAMINE HCL 100 MG/ML IJ SOLN
Freq: Once | INTRAVENOUS | Status: AC
  Filled 2022-11-03: qty 1000

## 2022-11-03 MED ORDER — PNEUMOCOCCAL 20-VAL CONJ VACC 0.5 ML IM SUSY
0.5000 mL | PREFILLED_SYRINGE | INTRAMUSCULAR | Status: AC
  Administered 2022-11-04: 0.5 mL via INTRAMUSCULAR
  Filled 2022-11-03: qty 0.5

## 2022-11-03 MED ORDER — LORAZEPAM 1 MG PO TABS
1.0000 mg | ORAL_TABLET | ORAL | Status: AC | PRN
  Administered 2022-11-03: 2 mg via ORAL
  Administered 2022-11-04: 3 mg via ORAL
  Administered 2022-11-04: 2 mg via ORAL
  Administered 2022-11-04: 3 mg via ORAL
  Administered 2022-11-04: 2 mg via ORAL
  Administered 2022-11-04 (×2): 3 mg via ORAL
  Administered 2022-11-05: 2 mg via ORAL
  Filled 2022-11-03 (×4): qty 2
  Filled 2022-11-03 (×4): qty 3

## 2022-11-03 MED ORDER — LORAZEPAM 2 MG/ML IJ SOLN
0.5000 mg | Freq: Once | INTRAMUSCULAR | Status: AC
  Administered 2022-11-03: 0.5 mg via INTRAVENOUS
  Filled 2022-11-03: qty 1

## 2022-11-03 MED ORDER — FOLIC ACID 1 MG PO TABS
1.0000 mg | ORAL_TABLET | Freq: Every day | ORAL | Status: DC
  Administered 2022-11-03 – 2022-11-06 (×4): 1 mg via ORAL
  Filled 2022-11-03 (×5): qty 1

## 2022-11-03 MED ORDER — LORAZEPAM 2 MG/ML IJ SOLN
0.0000 mg | Freq: Four times a day (QID) | INTRAMUSCULAR | Status: AC
  Administered 2022-11-03 (×3): 2 mg via INTRAVENOUS
  Administered 2022-11-03: 4 mg via INTRAVENOUS
  Administered 2022-11-04 (×4): 2 mg via INTRAVENOUS
  Filled 2022-11-03 (×3): qty 1
  Filled 2022-11-03: qty 2
  Filled 2022-11-03: qty 1
  Filled 2022-11-03: qty 2
  Filled 2022-11-03 (×2): qty 1

## 2022-11-03 MED ORDER — SODIUM CHLORIDE 0.9 % IV SOLN: INTRAVENOUS | Status: DC

## 2022-11-03 MED ORDER — LORAZEPAM 0.5 MG PO TABS
0.5000 mg | ORAL_TABLET | ORAL | Status: AC | PRN
  Administered 2022-11-05 – 2022-11-06 (×4): 0.5 mg via ORAL
  Filled 2022-11-03 (×4): qty 1

## 2022-11-03 MED ORDER — SODIUM CHLORIDE 0.9 % IV BOLUS: 1000.0000 mL | Freq: Once | INTRAVENOUS | Status: DC

## 2022-11-03 MED ORDER — ONDANSETRON HCL 4 MG/2ML IJ SOLN
4.0000 mg | Freq: Once | INTRAMUSCULAR | Status: AC
  Administered 2022-11-03: 4 mg via INTRAVENOUS
  Filled 2022-11-03: qty 2

## 2022-11-03 MED ORDER — ONDANSETRON HCL 4 MG PO TABS
4.0000 mg | ORAL_TABLET | Freq: Four times a day (QID) | ORAL | Status: DC | PRN
  Administered 2022-11-05 – 2022-11-06 (×2): 4 mg via ORAL
  Filled 2022-11-03 (×3): qty 1

## 2022-11-03 MED ORDER — ADULT MULTIVITAMIN W/MINERALS CH
1.0000 | ORAL_TABLET | Freq: Every day | ORAL | Status: DC
  Administered 2022-11-03 – 2022-11-06 (×4): 1 via ORAL
  Filled 2022-11-03 (×4): qty 1

## 2022-11-03 NOTE — ED Provider Notes (Signed)
-----------------------------------------   10:48 AM on 11/03/2022 -----------------------------------------  I took over care of this patient from Dr. Beather Arbour.  The patient remains tremulous, borderline tachycardic, and has a headache and nausea.  A few hours ago he had a CIWA score of 26 and received a dose of 4 mg of Ativan.  He also just received Reglan.  I am concerned for active alcohol withdrawal and do not feel that the patient will be safe for discharge to a detox or rehab facility.  I consulted Dr. Ernestina Patches from the hospitalist service; based our discussion he agrees to admit the patient.   Arta Silence, MD 11/03/22 1049

## 2022-11-03 NOTE — Assessment & Plan Note (Signed)
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

## 2022-11-03 NOTE — ED Notes (Signed)
1 yom lying in the bed with his head slightly elevated. The pt was warm, slightly pale and dry. The pt was very agitated c/o severe nausea and a headache. The pt also had a visible tremor. CIWA score was assessed and the pt was medicated after assessment.

## 2022-11-03 NOTE — Assessment & Plan Note (Signed)
Mild epigastric pain with episode of trace vomiting over the past 24 hours Noted baseline cirrhosis of liver with esophageal varices  Hemoglobin reassuring at 15.3 Will place on IV PPI  Monitor  GI consult if sxs worsen

## 2022-11-03 NOTE — H&P (Signed)
History and Physical    Patient: William Beasley [id number] DOB: 03/01/1996 DOA: 11/02/2022 DOS: the patient was seen and examined on 11/03/2022 [facility]: [facility]  Patient coming from: Home  Chief Complaint:  Chief Complaint  Patient presents with   Emesis   HPI: William Beasley is a 27 y.o. male with medical history significant of alcohol abuse, thrombocytopenia, cirrhosis, polysubstance abuse, sarcoidosis presenting with alcohol abuse.  Patient reports generalized malaise, vomiting with 1 episode of small bloody emesis over the past 24 hours.  Positive mild upper abdominal pain.  Baseline drinks around 10 beers per day.  Has drank around 20 beers per day over the past 1 to 2 weeks.  No fevers or chills.  No reported diarrhea.  No chest pain or shortness of breath.  The patient is considering detox. Presented to the ER afebrile hemodynamically stable.  Satting well on room air.  White count 5.7, hemoglobin 15.3, platelets of 89, creatinine 0.51, AST 86 ALT 67, alcohol level 293.  CT abdomen pelvis showing no acute findings apart from his cirrhosis with portal hypertension prior similar. Review of Systems: As mentioned in the history of present illness. All other systems reviewed and are negative. Past Medical History:  Diagnosis Date   Cirrhosis of liver ([place])    History reviewed. No pertinent surgical history. Social History:  reports that he has never smoked. He has never used smokeless tobacco. He reports current alcohol use. He reports that he does not use drugs.  No Known Allergies  History reviewed. No pertinent family history.  Prior to Admission medications   Medication Sig Start Date End Date Taking? Authorizing Provider  carvedilol (COREG) 6.25 MG tablet Take 1 tablet (6.25 mg total) by mouth 2 (two) times daily. 03/14/22  Yes Lorella Nimrod, MD  Cholecalciferol 25 MCG (1000 UT) capsule Take 1,000 Units by mouth daily. 07/02/22  Yes [provider]   folic acid (FOLVITE) 1 MG tablet Take 1 tablet (1 mg total) by mouth daily. 03/14/22  Yes Lorella Nimrod, MD  Multiple Vitamins-Minerals (THERA-M) TABS Take 1 tablet by mouth daily. 08/22/21  Yes [provider]  ondansetron (ZOFRAN-ODT) 4 MG disintegrating tablet Take 1 tablet (4 mg total) by mouth every 8 (eight) hours as needed for nausea or vomiting. 11/03/22  Yes Paulette Blanch, MD  thiamine (VITAMIN B1) 100 MG tablet Take 1 tablet (100 mg total) by mouth daily. 03/14/22  Yes Lorella Nimrod, MD  traMADol (ULTRAM) 50 MG tablet Take 1 tablet (50 mg total) by mouth every 6 (six) hours as needed. 01/22/22  Yes Luvenia Redden, PA-C  traZODone (DESYREL) 50 MG tablet Take 50 mg by mouth at bedtime. 05/15/22  Yes [provider]  fluticasone (FLONASE) 50 MCG/ACT nasal spray Place 1 spray into both nostrils daily. 07/04/18 07/04/19  Merlyn Lot, MD  pantoprazole (PROTONIX) 40 MG tablet Take 1 tablet (40 mg total) by mouth 2 (two) times daily. 03/14/22 05/13/22  Lorella Nimrod, MD    Physical Exam: Vitals:   11/03/22 1111 11/03/22 1130 11/03/22 1200 11/03/22 1207  BP:  135/78 130/80 130/80  Pulse:  95 95 97  Resp:  (!) 30 (!) 26   Temp: 98 F (36.7 C)     TempSrc: Oral     SpO2:  95% 96%   Weight:      Height:       Physical Exam Constitutional:      Appearance: He is obese.  HENT:  Head: Normocephalic and atraumatic.     Nose: Nose normal.     Mouth/Throat:     Mouth: Mucous membranes are moist.  Eyes:     Pupils: Pupils are equal, round, and reactive to light.  Cardiovascular:     Rate and Rhythm: Normal rate and regular rhythm.  Pulmonary:     Effort: Pulmonary effort is normal.  Abdominal:     General: Bowel sounds are normal.  Musculoskeletal:        General: Normal range of motion.     Cervical back: Normal range of motion.  Skin:    General: Skin is warm.  Neurological:     General: No focal deficit present.  Psychiatric:        Mood and Affect:  Mood normal.     Data Reviewed:  There are no new results to review at this time. CT ABDOMEN PELVIS W CONTRAST CLINICAL DATA:  Epigastric pain.  EXAM: CT ABDOMEN AND PELVIS WITH CONTRAST  TECHNIQUE: Multidetector CT imaging of the abdomen and pelvis was performed using the standard protocol following bolus administration of intravenous contrast.  RADIATION DOSE REDUCTION: This exam was performed according to the departmental dose-optimization program which includes automated exposure control, adjustment of the mA and/or kV according to patient size and/or use of iterative reconstruction technique.  CONTRAST:  147mL OMNIPAQUE IOHEXOL 350 MG/ML SOLN  COMPARISON:  CT of the chest abdomen pelvis dated 03/12/2022.  FINDINGS: Lower chest: Minimal bibasilar atelectasis. The visualized lung bases are otherwise clear. Partially visualized hilar and mediastinal adenopathy, seen on the prior CT. Clinical correlation is recommended.  No intra-abdominal free air or free fluid.  Hepatobiliary: Fatty liver with morphologic changes of cirrhosis. No biliary dilatation. The gallbladder is unremarkable.  Pancreas: Unremarkable. No pancreatic ductal dilatation or surrounding inflammatory changes.  Spleen: Mild splenomegaly measuring up to 17 cm in AP length, similar to prior CT.  Adrenals/Urinary Tract: The adrenal glands are unremarkable. The kidneys, visualized ureters, and urinary bladder appear unremarkable.  Stomach/Bowel: There is no bowel obstruction or active inflammation. There is a small diverticula of the ascending colon without inflammatory changes. The appendix is normal.  Vascular/Lymphatic: The abdominal aorta and IVC are unremarkable. Paraesophageal varices as well as anterior abdominal collaterals noted. No portal venous gas. There is no adenopathy.  Reproductive: The prostate and seminal vesicles are grossly unremarkable. No pelvic mass.  Other:  None  Musculoskeletal: No acute or significant osseous findings.  IMPRESSION: 1. No acute intra-abdominal or pelvic pathology. 2. Cirrhosis with portal hypertension, similar to prior CT. 3. Partially visualized hilar and mediastinal adenopathy, seen on the prior CT.  Electronically Signed   By: Anner Crete M.D.   On: 11/02/2022 23:43   Lab Results  Component Value Date   WBC 5.7 11/02/2022   HGB 15.3 11/02/2022   HCT 43.5 11/02/2022   MCV 88.6 11/02/2022   PLT 89 (L) [id number]   Last metabolic panel Lab Results  Component Value Date   GLUCOSE 183 (H) 11/02/2022   NA 133 (L) 11/02/2022   K 3.8 11/02/2022   CL 97 (L) 11/02/2022   CO2 24 11/02/2022   BUN <5 (L) 11/02/2022   CREATININE 0.51 (L) 11/02/2022   GFRNONAA >60 11/02/2022   CALCIUM 8.5 (L) 11/02/2022   PHOS 3.9 03/14/2022   PROT 9.0 (H) 11/02/2022   ALBUMIN 4.3 11/02/2022   BILITOT 1.1 11/02/2022   ALKPHOS 105 11/02/2022   AST 86 (H) 11/02/2022   ALT 67 (H) 11/02/2022  ANIONGAP 12 11/02/2022    Assessment and Plan: * ETOH abuse Acute alcohol intoxication Patient reports drinking approximately 20 beers daily over the past 1 to 2 weeks Alcohol level 293 Start withdrawal protocol Thiamine and folate Multivitamin Discussed cessation Patient is considering detox center  Thrombocytopenia ([place]) Platelet count 80s Suspect secondary to chronic alcohol abuse Hold anticoagulation for now Follow  Alcoholic gastritis Mild epigastric pain with episode of trace vomiting over the past 24 hours Noted baseline cirrhosis of liver with esophageal varices  Hemoglobin reassuring at 15.3 Will place on IV PPI  Monitor  GI consult if sxs worsen    Liver cirrhosis ([place]) Followed by [facility]  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 Observation status is judged to be reasonable and necessary in order to provide the required intensity of service to ensure the patient's safety. The patient's presenting symptoms, physical exam findings, and initial radiographic and laboratory data in the context of their medical condition is felt to place them at decreased risk for further clinical deterioration. Furthermore, it is anticipated that the patient will be medically stable for discharge from the hospital within 2 midnights of admission.   Author: Deneise Lever, MD 11/03/2022 1:19 PM  For on call review [email or web address].

## 2022-11-03 NOTE — ED Notes (Signed)
Advised nurse that patient has ready bed 

## 2022-11-03 NOTE — ED Provider Notes (Signed)
-----------------------------------------   12:08 AM on 11/03/2022 -----------------------------------------   Care assumed of patient.  Updated him on unremarkable CT scan and lipase results.  He is feeling nauseous and shaky.  Will hang second liter IV fluids, low-dose IV Ativan, monitor and care for patient until sobriety.   ----------------------------------------- 6:26 AM on 11/03/2022 -----------------------------------------   Patient woke up, started moving around, drink a ginger ale.  Now dry heaving.  Will administer IV Phenergan and reassess.   ----------------------------------------- 6:54 AM on 11/03/2022 -----------------------------------------   Patient asking to speak with someone regarding alcohol detox.  Reports he drinks upwards of 30 beers daily.  Will administer banana bag and placed on CIWA protocol.  Will consult TTS to evaluate patient in the ER.  Care will be transferred to oncoming provider at change of shift.   Paulette Blanch, MD 11/03/22 [phone]

## 2022-11-03 NOTE — Assessment & Plan Note (Signed)
Platelet count 80s Suspect secondary to chronic alcohol abuse Hold anticoagulation for now Follow

## 2022-11-03 NOTE — Discharge Instructions (Signed)
You may take Zofran as needed for nausea/vomiting.  Drink alcohol only in moderation.  Clear liquid diet x 12 hours, then bland diet x 3 days, then slowly advance diet as tolerated.  Return to the ER for worsening symptoms, persistent vomiting, difficulty breathing or other concerns.  Intensive Outpatient Programs   [facility] [address] [place],  [place], [place] (Inpatient and [phone] (Suboxone and Methadone) [address] [phone]  ADS: Alcohol & Drug [facility] Programs - Intensive Outpatient [place] [phone] [place], [place], [place] (Outpatient, Inpatient, [facility] (Groups and Residental) (insurance only) [place], [place]   [facility] (for caregivers and family) [address] [place], [place]  Residential Treatment Programs  [facility](2 years) Residential: 69 days)[facility] (Section.) [place], [place], [place] or [phone]  [facility] [place], [place], [place]  [place] (RTS) [place], [place], [place] Admissions: 8am-3pm M-F  [facility] Program: Residential Program [phone] Days)             [facility]C: [facility]  [place], [place], [place] or [phone] in Hours over the  weekend or by referral)   [place] Crisis: [facility]:[phone] (for crisis response 24 hours a day)

## 2022-11-03 NOTE — Assessment & Plan Note (Signed)
Acute alcohol intoxication Patient reports drinking approximately 20 beers daily over the past 1 to 2 weeks Alcohol level 293 Start withdrawal protocol Thiamine and folate Multivitamin Discussed cessation Patient is considering detox center

## 2022-11-03 NOTE — Assessment & Plan Note (Signed)
UDS pending  

## 2022-11-04 DIAGNOSIS — K7030 Alcoholic cirrhosis of liver without ascites: Secondary | ICD-10-CM

## 2022-11-04 DIAGNOSIS — K2920 Alcoholic gastritis without bleeding: Secondary | ICD-10-CM

## 2022-11-04 LAB — COMPREHENSIVE METABOLIC PANEL
ALT: 60 U/L — ABNORMAL HIGH (ref 0–44)
AST: 67 U/L — ABNORMAL HIGH (ref 15–41)
Albumin: 3.7 g/dL (ref 3.5–5.0)
Alkaline Phosphatase: 90 U/L (ref 38–126)
Anion gap: 8 (ref 5–15)
BUN: 6 mg/dL (ref 6–20)
CO2: 25 mmol/L (ref 22–32)
Calcium: 9 mg/dL (ref 8.9–10.3)
Chloride: 102 mmol/L (ref 98–111)
Creatinine, Ser: 0.61 mg/dL (ref 0.61–1.24)
GFR, Estimated: >60 mL/min (ref >60)
Glucose, Bld: 90 mg/dL (ref 70–99)
Potassium: 3.5 mmol/L (ref 3.5–5.1)
Sodium: 135 mmol/L (ref 135–145)
Total Bilirubin: 1.3 mg/dL — ABNORMAL HIGH (ref 0.3–1.2)
Total Protein: 7.7 g/dL (ref 6.5–8.1)

## 2022-11-04 LAB — CBC
HCT: 40.6 % (ref 39.0–52.0)
Hemoglobin: 13.9 g/dL (ref 13.0–17.0)
MCH: 31.3 pg (ref 26.0–34.0)
MCHC: 34.2 g/dL (ref 30.0–36.0)
MCV: 91.4 fL (ref 80.0–100.0)
Platelets: 41 10*3/uL — ABNORMAL LOW (ref 150–400)
RBC: 4.44 MIL/uL (ref 4.22–5.81)
RDW: 13.2 % (ref 11.5–15.5)
WBC: 3 10*3/uL — ABNORMAL LOW (ref 4.0–10.5)
nRBC: 0 % (ref 0.0–0.2)

## 2022-11-04 MED ORDER — HYDROMORPHONE HCL 1 MG/ML IJ SOLN
1.0000 mg | Freq: Once | INTRAMUSCULAR | Status: AC
  Administered 2022-11-04: 1 mg via INTRAVENOUS
  Filled 2022-11-04: qty 1

## 2022-11-04 MED ORDER — MORPHINE SULFATE (PF) 2 MG/ML IV SOLN
2.0000 mg | INTRAVENOUS | Status: DC | PRN
  Administered 2022-11-04 – 2022-11-06 (×12): 2 mg via INTRAVENOUS
  Filled 2022-11-04 (×12): qty 1

## 2022-11-04 NOTE — TOC Initial Note (Signed)
Transition of Care [facility]) - Initial/Assessment Note    Patient Details  Name: William Beasley MRN: [id number] Date of Birth: 1995-08-18  Transition of Care [facility] CM/SW Contact:    Beverly Sessions, RN Phone Number: 11/04/2022, 1:35 PM  Clinical Narrative:                  Substance abuse resources added to AVS Per Care everywhere PCP [facility] [place]        Patient Goals and CMS Choice            Expected Discharge Plan and Services                                              Prior Living Arrangements/Services                       Activities of Daily Living Home Assistive Devices/Equipment: None ADL Screening (condition at time of admission) Patient's cognitive ability adequate to safely complete daily activities?: Yes Is the patient deaf or have difficulty hearing?: No Does the patient have difficulty seeing, even when wearing glasses/contacts?: No Does the patient have difficulty concentrating, remembering, or making decisions?: No Patient able to express need for assistance with ADLs?: Yes Does the patient have difficulty dressing or bathing?: No Independently performs ADLs?: Yes (appropriate for developmental age) Does the patient have difficulty walking or climbing stairs?: No Weakness of Legs: None Weakness of Arms/Hands: None  Permission Sought/Granted                  Emotional Assessment              Admission diagnosis:  Epigastric pain [R10.13] ETOH abuse [F10.10] Alcohol dependence with uncomplicated intoxication [F10.220] Alcohol withdrawal syndrome without complication 0000000 Alcoholic intoxication without complication 123456 Nausea and vomiting, unspecified vomiting type [R11.2] Patient Active Problem List   Diagnosis Date Noted   ETOH abuse 99991111   Alcoholic gastritis 99991111   Thrombocytopenia 11/03/2022   Cocaine poisoning 03/13/2022   Liver cirrhosis 03/13/2022   Acute GI bleeding 03/12/2022    PCP:  [facility]:   [facility] [address]), [place] [address] [place]  [address] Phone: [phone] Fax: [place] [address], [place] [address] Phone: [phone] Fax: [phone]     Social Determinants of Health (SDOH) Social History: SDOH Screenings   Food Insecurity: No Food Insecurity (11/03/2022)  Housing: Low Risk  (11/03/2022)  Transportation Needs: Unmet Transportation Needs (11/03/2022)  Utilities: Not At Risk (11/03/2022)  Tobacco Use: Low Risk  (11/02/2022)   SDOH Interventions:     Readmission Risk Interventions     No data to display

## 2022-11-04 NOTE — Progress Notes (Signed)
Patient is saying the morphine has not helped any with his pain. He has verbalized pain in his right side every time I come into his room. he is requesting something different for the pain. MD notified and ordered a once of dilaudid

## 2022-11-04 NOTE — Progress Notes (Signed)
Progress Note   Patient: William Beasley [id number] DOB: 09-08-1995 DOA: 11/02/2022     1 DOS: the patient was seen and examined on 11/04/2022   Brief hospital course: 27 y.o. male with medical history significant of alcohol abuse, thrombocytopenia, cirrhosis, polysubstance abuse, sarcoidosis presenting with alcohol abuse.  Patient reports generalized malaise, vomiting with 1 episode of small bloody emesis over the past 24 hours.  Positive mild upper abdominal pain.  Baseline drinks around 10 beers per day.  Has drank around 20 beers per day over the past 1 to 2 weeks.  No fevers or chills.  No reported diarrhea.  No chest pain or shortness of breath.  The patient is considering detox. Presented to the ER afebrile hemodynamically stable.  Satting well on room air.  White count 5.7, hemoglobin 15.3, platelets of 89, creatinine 0.51, AST 86 ALT 67, alcohol level 293.  CT abdomen pelvis showing no acute findings apart from his cirrhosis with portal hypertension prior similar.  1/4 : Patient CIWA less than 8 complains of generalized abdominal discomfort.  No complaint of pain.  Continues to stay on CIWA protocol.  Assessment and Plan:  ETOH abuse  Acute alcohol intoxication Patient reports drinking approximately 20 beers daily over the past 1 to 2 weeks Alcohol level 293 Started withdrawal protocol Thiamine and folate Multivitamin Discussed cessation Patient is considering detox center  Thrombocytopenia  Platelet count 80s Suspect secondary to chronic alcohol abuse Hold anticoagulation for now Follow  Alcoholic gastritis  Mild epigastric pain with episode of trace vomiting over the past 24 hours Noted baseline cirrhosis of liver with esophageal varices  Hemoglobin reassuring at 15.3 Will place on IV PPI  Monitor  GI consult if sxs worsen   Liver cirrhosis Followed by [facility]  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  No overnight events.  Vital labs and imaging reviewed.  Physical Exam: Vitals:   11/03/22 1638 11/03/22 1947 11/04/22 0432 11/04/22 0757  BP: 133/77 134/83 130/88 (!) 141/76  Pulse: 89 90 88 86  Resp: 18 20 20 18   Temp: 98.1 F (36.7 C) 98.4 F (36.9 C) 98 F (36.7 C) 98.2 F (36.8 C)  TempSrc: Oral Oral Oral Oral  SpO2: 95% 95% 93% 98%  Weight:      Height:       Physical Exam Constitutional:      Appearance: Normal appearance.  HENT:     Nose: Nose normal.  Eyes:     Extraocular Movements: Extraocular movements intact.     Pupils: Pupils are equal, round, and reactive to light.  Cardiovascular:     Rate and Rhythm: Normal rate and regular rhythm.  Pulmonary:     Effort: Pulmonary effort is normal.     Breath sounds: Normal breath sounds.  Abdominal:     General: There is distension.     Palpations: Abdomen is soft. There is no mass.     Tenderness: There is no abdominal tenderness. There is no guarding.     Hernia: No hernia is present.  Musculoskeletal:        General: Normal range of motion.     Cervical back: Normal range of motion.  Skin:    General: Skin is warm.  Neurological:     General: No focal deficit  present.     Mental Status: He is alert and oriented to person, place, and time.  Psychiatric:        Mood and Affect: Mood normal.        Behavior: Behavior normal.     Data Reviewed:  Results are pending, will review when available.  Family Communication: None by bedside  Disposition: Status is: Inpatient Remains inpatient appropriate because: Alcohol withdrawal  Planned Discharge Destination: Home    Time spent: 35 minutes  Author: Oran Rein, MD 11/04/2022 2:30 PM  For on call review [email or web address]

## 2022-11-05 MED ORDER — ENSURE MAX PROTEIN PO LIQD
11.0000 [oz_av] | Freq: Two times a day (BID) | ORAL | Status: DC
  Administered 2022-11-05 – 2022-11-06 (×2): 11 [oz_av] via ORAL

## 2022-11-05 NOTE — Progress Notes (Signed)
Initial Nutrition Assessment  DOCUMENTATION CODES:   Obesity unspecified  INTERVENTION:   Ensure Max protein supplement BID, each supplement provides 150kcal and 30g of protein.  MVI, folic acid and thiamine po daily   Pt at high refeed risk; recommend monitor potassium, magnesium and phosphorus labs daily until stable  Daily weights   High protein diet education   NUTRITION DIAGNOSIS:   Increased nutrient needs related to chronic illness (cirrhosis) as evidenced by estimated needs.  GOAL:   Patient will meet greater than or equal to 90% of their needs  MONITOR:   PO intake, Supplement acceptance, Labs, Weight trends, Skin, I & O's  REASON FOR ASSESSMENT:   Malnutrition Screening Tool    ASSESSMENT:   27 y.o. male with medical history significant of alcohol abuse, thrombocytopenia, cirrhosis, polysubstance abuse and sarcoidosis who is admitted with etoh intoxication and gastritis.  Met with pt in room today. Pt reports poor appetite and oral intake pta r/t abdominal pain and nausea. Pt complaining of liver pain today and is asking for pain medication. Pt documented to have eaten 100% of breakfast but reports that he did not touch his lunch as he is in too much pain to eat. RD discussed with pt the importance of adequate nutrition needed to preserve lean muscle. Pt is willing to drink supplements in hospital (prefers no sugar as he reports that this makes him nauseas). RD will add supplements to help pt meet his estimated needs. RD will also liberalize pt's diet. Pt is at high refeed risk. Per chart, pt with weight gain pta.   Medications reviewed and include: folic acid, MVI, protonix, thiamine, NaCl @100ml /hr   Labs reviewed: K 3.5 wnl Lipase- 58(H)- 3/30 Wbc- 3.0(L)- 4/1  NUTRITION - FOCUSED PHYSICAL EXAM:  Flowsheet Row Most Recent Value  Orbital Region No depletion  Upper Arm Region No depletion  Thoracic and Lumbar Region No depletion  Buccal Region No  depletion  Temple Region No depletion  Clavicle Bone Region No depletion  Clavicle and Acromion Bone Region No depletion  Scapular Bone Region No depletion  Dorsal Hand No depletion  Patellar Region No depletion  Anterior Thigh Region No depletion  Posterior Calf Region No depletion  Edema (RD Assessment) None  Hair Reviewed  Eyes Reviewed  Mouth Reviewed  Skin Reviewed  Nails Reviewed   Diet Order:   Diet Order             Diet 2 gram sodium Room service appropriate? Yes; Fluid consistency: Thin  Diet effective now                  EDUCATION NEEDS:   Education needs have been addressed  Skin:  Skin Assessment: Reviewed RN Assessment  Last BM:  4/1  Height:   Ht Readings from Last 1 Encounters:  11/02/22 5\' 9"  (1.753 m)    Weight:   Wt Readings from Last 1 Encounters:  11/02/22 116.3 kg    Ideal Body Weight:  72.7 kg  BMI:  Body mass index is 37.86 kg/m.  Estimated Nutritional Needs:   Kcal:  2500-2800kcal/day  Protein:  >125g/day  Fluid:  1.8-2.1L/day  Koleen Distance MS, RD, LDN Please refer to [facility] for RD and/or RD on-call/weekend/after hours pager

## 2022-11-05 NOTE — Progress Notes (Signed)
. Progress Note   Patient: William Beasley [id number] DOB: 1996/03/08 DOA: 11/02/2022     2 DOS: the patient was seen and examined on 11/05/2022   Brief hospital course: 27 y.o. male with medical history significant of alcohol abuse, thrombocytopenia, cirrhosis, polysubstance abuse, sarcoidosis presenting with alcohol abuse.  Patient reports generalized malaise, vomiting with 1 episode of small bloody emesis over the past 24 hours.  Positive mild upper abdominal pain.  Baseline drinks around 10 beers per day.  Has drank around 20 beers per day over the past 1 to 2 weeks.  No fevers or chills.  No reported diarrhea.  No chest pain or shortness of breath.  The patient is considering detox. Presented to the ER afebrile hemodynamically stable.  Satting well on room air.  White count 5.7, hemoglobin 15.3, platelets of 89, creatinine 0.51, AST 86 ALT 67, alcohol level 293.  CT abdomen pelvis showing no acute findings apart from his cirrhosis with portal hypertension prior similar.  4/1: Patient CIWA less than 8 complains of generalized abdominal discomfort.  No complaint of pain.  Continues to stay on CIWA protocol.  4/2 : Patient CIWA score has been consistently less than 7.  Feels better.    Assessment and Plan:  ETOH abuse  Acute alcohol intoxication Patient reports drinking approximately 20 beers daily over the past 1 to 2 weeks Alcohol level 293 Started withdrawal protocol Thiamine and folate Multivitamin Discussed cessation Patient is considering detox center  Thrombocytopenia  Platelet count 80s Suspect secondary to chronic alcohol abuse Hold anticoagulation for now Follow  Alcoholic gastritis  Mild epigastric pain with episode of trace vomiting over the past 24 hours Noted baseline cirrhosis of liver with esophageal varices  Hemoglobin reassuring at 15.3 Will place on IV PPI  Monitor  GI consult if sxs worsen   Liver cirrhosis Followed by [facility]  Some concern for  alcoholic liver disease and hepatic sarcoidosis  AST 86, ALT 67  Monitor for now   Cocaine poisoning UDS pending      Subjective: Patient seen and examined this morning, showing concern about his liver.  No overnight events.  Vital labs and imaging reviewed.  Physical Exam: Vitals:   11/04/22 2356 11/05/22 0426 11/05/22 0834 11/05/22 1514  BP: 119/76 (!) 129/93 120/86 (!) 139/95  Pulse: 78 81 74 81  Resp: 20 20 20 20   Temp: 98.9 F (37.2 C) 98.2 F (36.8 C) 98.2 F (36.8 C) 98 F (36.7 C)  TempSrc:   Oral Oral  SpO2: 98% 96% 98% 97%  Weight:      Height:       Physical Exam Constitutional:      Appearance: Normal appearance.  HENT:     Nose: Nose normal.  Eyes:     Extraocular Movements: Extraocular movements intact.     Pupils: Pupils are equal, round, and reactive to light.  Cardiovascular:     Rate and Rhythm: Normal rate and regular rhythm.  Pulmonary:     Effort: Pulmonary effort is normal.     Breath sounds: Normal breath sounds.  Abdominal:     General: There is distension.     Palpations: Abdomen is soft. There is no mass.     Tenderness: There is no abdominal tenderness. There is no guarding.     Hernia: No hernia is present.  Musculoskeletal:        General: Normal range of motion.     Cervical back: Normal range of motion.  Skin:    General: Skin is warm.  Neurological:     General: No focal deficit present.     Mental Status: He is alert and oriented to person, place, and time.  Psychiatric:        Mood and Affect: Mood normal.        Behavior: Behavior normal.     Data Reviewed:  Results are pending, will review when available.  Family Communication: None by bedside  Disposition: Status is: Inpatient Remains inpatient appropriate because: Alcohol withdrawal  Planned Discharge Destination: Home    Time spent: 35 minutes  Author: Oran Rein, MD 11/05/2022 3:22 PM  For on call review [email or web address].

## 2022-11-06 LAB — GLUCOSE, CAPILLARY: Glucose-Capillary: 99 mg/dL (ref 70–99)

## 2022-11-06 LAB — PHOSPHORUS: Phosphorus: 4 mg/dL (ref 2.5–4.6)

## 2022-11-06 LAB — MAGNESIUM: Magnesium: 2 mg/dL (ref 1.7–2.4)

## 2022-11-06 MED ORDER — CHLORDIAZEPOXIDE HCL 10 MG PO CAPS
50.0000 mg | ORAL_CAPSULE | Freq: Once | ORAL | Status: AC
  Administered 2022-11-06: 50 mg via ORAL
  Filled 2022-11-06: qty 5

## 2022-11-06 MED ORDER — FOLIC ACID 1 MG PO TABS: 1.0000 mg | ORAL_TABLET | Freq: Every day | ORAL | 0 refills | Status: AC

## 2022-11-06 MED ORDER — PREDNISONE 20 MG PO TABS
40.0000 mg | ORAL_TABLET | Freq: Once | ORAL | Status: AC
  Administered 2022-11-06: 40 mg via ORAL
  Filled 2022-11-06: qty 2

## 2022-11-06 MED ORDER — ADULT MULTIVITAMIN W/MINERALS CH: 1.0000 | ORAL_TABLET | Freq: Every day | ORAL | 0 refills | Status: AC

## 2022-11-06 MED ORDER — PANTOPRAZOLE SODIUM 40 MG PO TBEC
40.0000 mg | DELAYED_RELEASE_TABLET | Freq: Two times a day (BID) | ORAL | Status: DC
  Administered 2022-11-06: 40 mg via ORAL
  Filled 2022-11-06: qty 1

## 2022-11-06 MED ORDER — THIAMINE HCL 100 MG PO TABS: 100.0000 mg | ORAL_TABLET | Freq: Every day | ORAL | 2 refills | Status: DC

## 2022-11-06 MED ORDER — MORPHINE SULFATE (PF) 2 MG/ML IV SOLN: 2.0000 mg | Freq: Three times a day (TID) | INTRAVENOUS | Status: DC | PRN

## 2022-11-06 NOTE — Plan of Care (Signed)
  Problem: Clinical Measurements: Goal: Diagnostic test results will improve Outcome: Progressing   Problem: Activity: Goal: Risk for activity intolerance will decrease Outcome: Progressing   Problem: Nutrition: Goal: Adequate nutrition will be maintained Outcome: Progressing   Problem: Safety: Goal: Ability to remain free from injury will improve Outcome: Progressing   

## 2022-11-06 NOTE — Progress Notes (Signed)
William Beasley to be discharged Home per MD order. Discussed prescriptions and follow up appointments with the patient. Prescriptions given to patient, medication list explained in detail. Patient verbalized understanding.  Allergies as of 11/06/2022   No Known Allergies      Medication List     TAKE these medications    carvedilol 6.25 MG tablet Commonly known as: COREG Take 1 tablet (6.25 mg total) by mouth 2 (two) times daily.   Cholecalciferol 25 MCG (1000 UT) capsule Take 1,000 Units by mouth daily.   fluticasone 50 MCG/ACT nasal spray Commonly known as: Flonase Place 1 spray into both nostrils daily.   folic acid 1 MG tablet Commonly known as: FOLVITE Take 1 tablet (1 mg total) by mouth daily. What changed: Another medication with the same name was added. Make sure you understand how and when to take each.   folic acid 1 MG tablet Commonly known as: FOLVITE Take 1 tablet (1 mg total) by mouth daily. Start taking on: November 07, 2022 What changed: You were already taking a medication with the same name, and this prescription was added. Make sure you understand how and when to take each.   multivitamin with minerals Tabs tablet Take 1 tablet by mouth daily. Start taking on: November 07, 2022   ondansetron 4 MG disintegrating tablet Commonly known as: ZOFRAN-ODT Take 1 tablet (4 mg total) by mouth every 8 (eight) hours as needed for nausea or vomiting.   pantoprazole 40 MG tablet Commonly known as: Protonix Take 1 tablet (40 mg total) by mouth 2 (two) times daily.   Thera-M Tabs Take 1 tablet by mouth daily.   thiamine 100 MG tablet Commonly known as: VITAMIN B1 Take 1 tablet (100 mg total) by mouth daily.   thiamine 100 MG tablet Commonly known as: VITAMIN B1 Take 1 tablet (100 mg total) by mouth daily. Start taking on: November 07, 2022   traMADol 50 MG tablet Commonly known as: ULTRAM Take 1 tablet (50 mg total) by mouth every 6 (six) hours as needed.    traZODone 50 MG tablet Commonly known as: DESYREL Take 50 mg by mouth at bedtime.        Vitals:   11/06/22 0319 11/06/22 0816  BP: (!) 146/95 (!) 154/87  Pulse: 84 81  Resp: 16 18  Temp: 98.3 F (36.8 C) 98.4 F (36.9 C)  SpO2: 97% 98%    Skin clean, dry and intact without evidence of skin break down and or skin tears. IV catheter discontinued intact. Site without signs and symptoms of complications. Dressing and pressure applied. Patient denies pain at this time. No complaints noted.  An After Visit Summary was printed and given to the patient. Patient escorted via wheelchair and discharged Home home via private auto.  Fuller Mandril, RN

## 2022-11-06 NOTE — Progress Notes (Signed)
PHARMACIST - PHYSICIAN COMMUNICATION  CONCERNING: IV to Oral Route Change Policy  RECOMMENDATION: This patient is receiving pantoprazole by the intravenous route.  Based on criteria approved by the Pharmacy and Therapeutics Committee, the intravenous medication(s) is/are being converted to the equivalent oral dose form(s).  DESCRIPTION: These criteria include: The patient is eating (either orally or via tube) and/or has been taking other orally administered medications for a least 24 hours The patient has no evidence of active gastrointestinal bleeding or impaired GI absorption (gastrectomy, short bowel, patient on TNA or NPO).  If you have questions about this conversion, please contact the [place], [facility] 11/06/2022 8:19 AM

## 2022-11-06 NOTE — Discharge Summary (Signed)
Physician Discharge Summary   Patient: William Beasley MRN: [id number] DOB: 03/31/1996  Admit date:     11/02/2022  Discharge date: 11/06/22  Discharge Physician: Oran Rein   PCP: [facility]   Recommendations at discharge:   Follow-up with PCP in 1 to 2 weeks. Follow-up with GI [facility] in 1 to 2 weeks. Discharge Diagnoses: Principal Problem:   ETOH abuse Active Problems:   Cocaine poisoning   Liver cirrhosis   Alcoholic gastritis   Thrombocytopenia  Resolved Problems:   * No resolved hospital problems. *  Hospital Course:  27 y.o. male with medical history significant of alcohol abuse, thrombocytopenia, cirrhosis, polysubstance abuse, sarcoidosis presenting with alcohol abuse.  Patient reports generalized malaise, vomiting with 1 episode of small bloody emesis over the past 24 hours.  Positive mild upper abdominal pain.  Baseline drinks around 10 beers per day.  Has drank around 20 beers per day over the past 1 to 2 weeks.  No fevers or chills.  No reported diarrhea.  No chest pain or shortness of breath.  The patient is considering detox. Presented to the ER afebrile hemodynamically stable.  Satting well on room air.  White count 5.7, hemoglobin 15.3, platelets of 89, creatinine 0.51, AST 86 ALT 67, alcohol level 293.  CT abdomen pelvis showing no acute findings apart from his cirrhosis with portal hypertension prior similar.   4/1: Patient CIWA less than 8 complains of generalized abdominal discomfort.  No complaint of pain.  Continues to stay on CIWA protocol.   4/2 : Patient CIWA score has been consistently less than 7.  Feels better.    4/ 3: CIWA score 0 has not received any Ativan today.  Has been on morphine for the past 24 hours for pain.  Reports of right big toe pain.  Ordered prednisone 40 mg 1 dose.  Patient prior to receiving dose was able to take shower with no complaint.  He is hemodynamically stable.  With no active complaint.  Wants to go home.   Advised to follow-up with PCP.   Assessment and Plan: * ETOH abuse  Acute alcohol intoxication Patient reports drinking approximately 20 beers daily over the past 1 to 2 weeks Alcohol level 293 Start withdrawal protocol Thiamine and folate Multivitamin Discussed cessation Patient is considering detox center  Thrombocytopenia Platelet count 80s Suspect secondary to chronic alcohol abuse Hold anticoagulation for now Follow  Alcoholic gastritis Mild epigastric pain with episode of trace vomiting over the past 24 hours Noted baseline cirrhosis of liver with esophageal varices  Hemoglobin reassuring at 15.3 Will place on IV PPI  Monitor  GI consult if sxs worsen   Liver cirrhosis Followed by [facility]  Some concern for alcoholic liver disease and hepatic sarcoidosis  AST 86, ALT 67  Monitor for now   Cocaine poisoning UDS pending      Pain control - [place] Controlled Substance Reporting System database was reviewed. and patient was instructed, not to drive, operate heavy machinery, perform activities at heights, swimming or participation in water activities or provide baby-sitting services while on Pain, Sleep and Anxiety Medications; until their outpatient Physician has advised to do so again. Also recommended to not to take more than prescribed Pain, Sleep and Anxiety Medications.  Consultants:  Procedures performed: None   Disposition: Home Diet recommendation:  Discharge Diet Orders (From admission, onward)     Start     Ordered   11/06/22 0000  Diet - low sodium heart  healthy        11/06/22 1254           Cardiac diet DISCHARGE MEDICATION: Allergies as of 11/06/2022   No Known Allergies      Medication List     TAKE these medications    carvedilol 6.25 MG tablet Commonly known as: COREG Take 1 tablet (6.25 mg total) by mouth 2 (two) times daily.   Cholecalciferol 25 MCG (1000 UT) capsule Take 1,000 Units by mouth daily.   fluticasone 50  MCG/ACT nasal spray Commonly known as: Flonase Place 1 spray into both nostrils daily.   folic acid 1 MG tablet Commonly known as: FOLVITE Take 1 tablet (1 mg total) by mouth daily. What changed: Another medication with the same name was added. Make sure you understand how and when to take each.   folic acid 1 MG tablet Commonly known as: FOLVITE Take 1 tablet (1 mg total) by mouth daily. Start taking on: November 07, 2022 What changed: You were already taking a medication with the same name, and this prescription was added. Make sure you understand how and when to take each.   multivitamin with minerals Tabs tablet Take 1 tablet by mouth daily. Start taking on: November 07, 2022   ondansetron 4 MG disintegrating tablet Commonly known as: ZOFRAN-ODT Take 1 tablet (4 mg total) by mouth every 8 (eight) hours as needed for nausea or vomiting.   pantoprazole 40 MG tablet Commonly known as: Protonix Take 1 tablet (40 mg total) by mouth 2 (two) times daily.   Thera-M Tabs Take 1 tablet by mouth daily.   thiamine 100 MG tablet Commonly known as: VITAMIN B1 Take 1 tablet (100 mg total) by mouth daily.   thiamine 100 MG tablet Commonly known as: VITAMIN B1 Take 1 tablet (100 mg total) by mouth daily. Start taking on: November 07, 2022   traMADol 50 MG tablet Commonly known as: ULTRAM Take 1 tablet (50 mg total) by mouth every 6 (six) hours as needed.   traZODone 50 MG tablet Commonly known as: DESYREL Take 50 mg by mouth at bedtime.        Follow-up Information     [place], [facility]. Schedule an appointment as soon as possible for a visit in 3 days.   Contact information: [place] [address] [phone]                Discharge Exam: Danley Danker Weights   11/02/22 2128 11/06/22 0320  Weight: 116.3 kg 113.6 kg   Physical Exam Constitutional:      Appearance: Normal appearance.  HENT:     Head: Normocephalic and atraumatic.  Eyes:      Extraocular Movements: Extraocular movements intact.     Pupils: Pupils are equal, round, and reactive to light.  Cardiovascular:     Rate and Rhythm: Normal rate and regular rhythm.  Pulmonary:     Effort: Pulmonary effort is normal.  Abdominal:     Palpations: Abdomen is soft.     Comments: Obese abdomen with no tenderness  Musculoskeletal:        General: Normal range of motion.     Comments: Right toe with tenderness however no erythema or warmth  Neurological:     General: No focal deficit present.     Mental Status: He is alert and oriented to person, place, and time.  Psychiatric:        Mood and Affect: Mood normal.  Behavior: Behavior normal.        Thought Content: Thought content normal.      Condition at discharge: fair  The results of significant diagnostics from this hospitalization (including imaging, microbiology, ancillary and laboratory) are listed below for reference.   Imaging Studies: CT ABDOMEN PELVIS W CONTRAST  Result Date: 11/02/2022 CLINICAL DATA:  Epigastric pain. EXAM: CT ABDOMEN AND PELVIS WITH CONTRAST TECHNIQUE: Multidetector CT imaging of the abdomen and pelvis was performed using the standard protocol following bolus administration of intravenous contrast. RADIATION DOSE REDUCTION: This exam was performed according to the departmental dose-optimization program which includes automated exposure control, adjustment of the mA and/or kV according to patient size and/or use of iterative reconstruction technique. CONTRAST:  176mL OMNIPAQUE IOHEXOL 350 MG/ML SOLN COMPARISON:  CT of the chest abdomen pelvis dated 03/12/2022. FINDINGS: Lower chest: Minimal bibasilar atelectasis. The visualized lung bases are otherwise clear. Partially visualized hilar and mediastinal adenopathy, seen on the prior CT. Clinical correlation is recommended. No intra-abdominal free air or free fluid. Hepatobiliary: Fatty liver with morphologic changes of cirrhosis. No biliary  dilatation. The gallbladder is unremarkable. Pancreas: Unremarkable. No pancreatic ductal dilatation or surrounding inflammatory changes. Spleen: Mild splenomegaly measuring up to 17 cm in AP length, similar to prior CT. Adrenals/Urinary Tract: The adrenal glands are unremarkable. The kidneys, visualized ureters, and urinary bladder appear unremarkable. Stomach/Bowel: There is no bowel obstruction or active inflammation. There is a small diverticula of the ascending colon without inflammatory changes. The appendix is normal. Vascular/Lymphatic: The abdominal aorta and IVC are unremarkable. Paraesophageal varices as well as anterior abdominal collaterals noted. No portal venous gas. There is no adenopathy. Reproductive: The prostate and seminal vesicles are grossly unremarkable. No pelvic mass. Other: None Musculoskeletal: No acute or significant osseous findings. IMPRESSION: 1. No acute intra-abdominal or pelvic pathology. 2. Cirrhosis with portal hypertension, similar to prior CT. 3. Partially visualized hilar and mediastinal adenopathy, seen on the prior CT. Electronically Signed   By: Anner Crete M.D.   On: 11/02/2022 23:43    Microbiology: Results for orders placed or performed during the hospital encounter of 03/12/22  MRSA Next Gen by PCR, Nasal     Status: None   Collection Time: 03/12/22  8:56 PM   Specimen: Nasal Mucosa; Nasal Swab  Result Value Ref Range Status   MRSA by PCR Next Gen NOT DETECTED NOT DETECTED Final    Comment: (NOTE) The GeneXpert MRSA Assay (FDA approved for NASAL specimens only), is one component of a comprehensive MRSA colonization surveillance program. It is not intended to diagnose MRSA infection nor to guide or monitor treatment for MRSA infections. Test performance is not FDA approved in patients less than 73 years old. Performed at [facility], [address] [place], [place] [address]   Culture, blood (Routine X 2) w Reflex to ID Panel      Status: None   Collection Time: 03/13/22  2:08 AM   Specimen: BLOOD  Result Value Ref Range Status   Specimen Description BLOOD LEFT HAND  Final   Special Requests   Final    BOTTLES DRAWN AEROBIC AND ANAEROBIC Blood Culture adequate volume   Culture   Final    NO GROWTH 5 DAYS Performed at [facility], [address], [place], [place] [address]    Report Status 03/18/2022 FINAL  Final  Culture, blood (Routine X 2) w Reflex to ID Panel     Status: None   Collection Time: 03/13/22  2:08  AM   Specimen: BLOOD  Result Value Ref Range Status   Specimen Description BLOOD LEFT FOREARM  Final   Special Requests   Final    IN PEDIATRIC BOTTLE Blood Culture results may not be optimal due to an excessive volume of blood received in culture bottles   Culture   Final    NO GROWTH 5 DAYS Performed at [facility], [address] [place], [place] [address]    Report Status 03/18/2022 FINAL  Final    Labs: CBC: Recent Labs  Lab 11/02/22 2141 11/04/22 0418  WBC 5.7 3.0*  HGB 15.3 13.9  HCT 43.5 40.6  MCV 88.6 91.4  PLT 89* 41*   Basic Metabolic Panel: Recent Labs  Lab 11/02/22 2141 11/04/22 0418 11/06/22 0451  NA 133* 135  --   K 3.8 3.5  --   CL 97* 102  --   CO2 24 25  --   GLUCOSE 183* 90  --   BUN <5* 6  --   CREATININE 0.51* 0.61  --   CALCIUM 8.5* 9.0  --   MG  --   --  2.0  PHOS  --   --  4.0   Liver Function Tests: Recent Labs  Lab 11/02/22 2141 11/04/22 0418  AST 86* 67*  ALT 67* 60*  ALKPHOS 105 90  BILITOT 1.1 1.3*  PROT 9.0* 7.7  ALBUMIN 4.3 3.7   CBG: Recent Labs  Lab 11/06/22 0821  GLUCAP 99    Discharge time spent: greater than 30 minutes.  Signed: Oran Rein, MD [facility] 11/06/2022

## 2022-11-20 ENCOUNTER — Encounter: Payer: Self-pay | Admitting: Emergency Medicine

## 2022-11-20 ENCOUNTER — Other Ambulatory Visit: Payer: Self-pay

## 2022-11-20 ENCOUNTER — Inpatient Hospital Stay
Admission: EM | Admit: 2022-11-20 | Discharge: 2022-11-26 | DRG: 378 | Disposition: A | Discharge status: Home / Self Care | Payer: BLUE CROSS/BLUE SHIELD | Attending: Internal Medicine | Admitting: Internal Medicine

## 2022-11-20 DIAGNOSIS — F10239 Alcohol dependence with withdrawal, unspecified: Secondary | ICD-10-CM | POA: Diagnosis present

## 2022-11-20 DIAGNOSIS — K766 Portal hypertension: Secondary | ICD-10-CM | POA: Diagnosis present

## 2022-11-20 DIAGNOSIS — D696 Thrombocytopenia, unspecified: Secondary | ICD-10-CM | POA: Diagnosis present

## 2022-11-20 DIAGNOSIS — K7460 Unspecified cirrhosis of liver: Secondary | ICD-10-CM | POA: Diagnosis present

## 2022-11-20 DIAGNOSIS — Z91148 Patient's other noncompliance with medication regimen for other reason: Secondary | ICD-10-CM

## 2022-11-20 DIAGNOSIS — Z79899 Other long term (current) drug therapy: Secondary | ICD-10-CM

## 2022-11-20 DIAGNOSIS — K2921 Alcoholic gastritis with bleeding: Principal | ICD-10-CM | POA: Diagnosis present

## 2022-11-20 DIAGNOSIS — K2920 Alcoholic gastritis without bleeding: Secondary | ICD-10-CM | POA: Diagnosis present

## 2022-11-20 DIAGNOSIS — F32A Depression, unspecified: Secondary | ICD-10-CM | POA: Diagnosis present

## 2022-11-20 DIAGNOSIS — F419 Anxiety disorder, unspecified: Secondary | ICD-10-CM | POA: Diagnosis present

## 2022-11-20 DIAGNOSIS — K7030 Alcoholic cirrhosis of liver without ascites: Secondary | ICD-10-CM | POA: Diagnosis present

## 2022-11-20 DIAGNOSIS — F1910 Other psychoactive substance abuse, uncomplicated: Secondary | ICD-10-CM | POA: Diagnosis present

## 2022-11-20 DIAGNOSIS — I10 Essential (primary) hypertension: Secondary | ICD-10-CM | POA: Diagnosis present

## 2022-11-20 DIAGNOSIS — K3189 Other diseases of stomach and duodenum: Secondary | ICD-10-CM | POA: Diagnosis present

## 2022-11-20 DIAGNOSIS — K920 Hematemesis: Principal | ICD-10-CM | POA: Diagnosis present

## 2022-11-20 DIAGNOSIS — F10230 Alcohol dependence with withdrawal, uncomplicated: Secondary | ICD-10-CM | POA: Diagnosis not present

## 2022-11-20 DIAGNOSIS — F1410 Cocaine abuse, uncomplicated: Secondary | ICD-10-CM | POA: Diagnosis present

## 2022-11-20 DIAGNOSIS — D8689 Sarcoidosis of other sites: Secondary | ICD-10-CM | POA: Diagnosis present

## 2022-11-20 DIAGNOSIS — R1011 Right upper quadrant pain: Secondary | ICD-10-CM | POA: Diagnosis present

## 2022-11-20 DIAGNOSIS — R112 Nausea with vomiting, unspecified: Principal | ICD-10-CM

## 2022-11-20 LAB — COMPREHENSIVE METABOLIC PANEL
ALT: 59 U/L — ABNORMAL HIGH (ref 0–44)
AST: 61 U/L — ABNORMAL HIGH (ref 15–41)
Albumin: 3.7 g/dL (ref 3.5–5.0)
Alkaline Phosphatase: 90 U/L (ref 38–126)
Anion gap: 12 (ref 5–15)
BUN: 9 mg/dL (ref 6–20)
CO2: 22 mmol/L (ref 22–32)
Calcium: 8.6 mg/dL — ABNORMAL LOW (ref 8.9–10.3)
Chloride: 105 mmol/L (ref 98–111)
Creatinine, Ser: 0.61 mg/dL (ref 0.61–1.24)
GFR, Estimated: >60 mL/min (ref >60)
Glucose, Bld: 114 mg/dL — ABNORMAL HIGH (ref 70–99)
Potassium: 3.2 mmol/L — ABNORMAL LOW (ref 3.5–5.1)
Sodium: 139 mmol/L (ref 135–145)
Total Bilirubin: 1.2 mg/dL (ref 0.3–1.2)
Total Protein: 8.4 g/dL — ABNORMAL HIGH (ref 6.5–8.1)

## 2022-11-20 LAB — CBC
HCT: 44.5 % (ref 39.0–52.0)
Hemoglobin: 15.4 g/dL (ref 13.0–17.0)
MCH: 31.8 pg (ref 26.0–34.0)
MCHC: 34.6 g/dL (ref 30.0–36.0)
MCV: 91.8 fL (ref 80.0–100.0)
Platelets: 142 10*3/uL — ABNORMAL LOW (ref 150–400)
RBC: 4.85 MIL/uL (ref 4.22–5.81)
RDW: 15.4 % (ref 11.5–15.5)
WBC: 6.1 10*3/uL (ref 4.0–10.5)
nRBC: 0 % (ref 0.0–0.2)

## 2022-11-20 LAB — MAGNESIUM: Magnesium: 2 mg/dL (ref 1.7–2.4)

## 2022-11-20 LAB — PROTIME-INR
INR: 1.3 — ABNORMAL HIGH (ref 0.8–1.2)
Prothrombin Time: 15.6 seconds — ABNORMAL HIGH (ref 11.4–15.2)

## 2022-11-20 LAB — URINALYSIS, ROUTINE W REFLEX MICROSCOPIC
Bilirubin Urine: NEGATIVE
Glucose, UA: NEGATIVE mg/dL
Hgb urine dipstick: NEGATIVE
Ketones, ur: 5 mg/dL — AB
Leukocytes,Ua: NEGATIVE
Nitrite: NEGATIVE
Protein, ur: NEGATIVE mg/dL
Specific Gravity, Urine: 1.017 (ref 1.005–1.030)
pH: 7 (ref 5.0–8.0)

## 2022-11-20 LAB — URINE DRUG SCREEN, QUALITATIVE (ARMC ONLY)
Amphetamines, Ur Screen: NOT DETECTED
Barbiturates, Ur Screen: NOT DETECTED
Benzodiazepine, Ur Scrn: POSITIVE — AB
Cannabinoid 50 Ng, Ur ~~LOC~~: POSITIVE — AB
Cocaine Metabolite,Ur ~~LOC~~: POSITIVE — AB
MDMA (Ecstasy)Ur Screen: NOT DETECTED
Methadone Scn, Ur: NOT DETECTED
Opiate, Ur Screen: POSITIVE — AB
Phencyclidine (PCP) Ur S: NOT DETECTED
Tricyclic, Ur Screen: NOT DETECTED

## 2022-11-20 LAB — LIPASE, BLOOD: Lipase: 29 U/L (ref 11–51)

## 2022-11-20 LAB — TYPE AND SCREEN
ABO/RH(D): A POS
Antibody Screen: NEGATIVE

## 2022-11-20 MED ORDER — CHLORDIAZEPOXIDE HCL 10 MG PO CAPS
50.0000 mg | ORAL_CAPSULE | Freq: Three times a day (TID) | ORAL | Status: DC
  Administered 2022-11-20 – 2022-11-21 (×3): 50 mg via ORAL
  Filled 2022-11-20 (×3): qty 2

## 2022-11-20 MED ORDER — SODIUM CHLORIDE 0.9 % IV BOLUS
500.0000 mL | Freq: Once | INTRAVENOUS | Status: AC
  Administered 2022-11-20: 500 mL via INTRAVENOUS

## 2022-11-20 MED ORDER — SODIUM CHLORIDE 0.9 % IV SOLN
2.0000 g | INTRAVENOUS | Status: DC
  Administered 2022-11-20 – 2022-11-25 (×6): 2 g via INTRAVENOUS
  Filled 2022-11-20 (×4): qty 20
  Filled 2022-11-20: qty 2
  Filled 2022-11-20 (×2): qty 20

## 2022-11-20 MED ORDER — SORBITOL 70 % SOLN: 30.0000 mL | Freq: Every day | Status: DC | PRN

## 2022-11-20 MED ORDER — PANTOPRAZOLE SODIUM 40 MG IV SOLR
40.0000 mg | Freq: Once | INTRAVENOUS | Status: AC
  Administered 2022-11-20: 40 mg via INTRAVENOUS
  Filled 2022-11-20: qty 10

## 2022-11-20 MED ORDER — PROCHLORPERAZINE EDISYLATE 10 MG/2ML IJ SOLN
10.0000 mg | Freq: Four times a day (QID) | INTRAMUSCULAR | Status: DC | PRN
  Administered 2022-11-20: 10 mg via INTRAVENOUS
  Filled 2022-11-20: qty 2

## 2022-11-20 MED ORDER — SENNOSIDES-DOCUSATE SODIUM 8.6-50 MG PO TABS: 1.0000 | ORAL_TABLET | Freq: Every evening | ORAL | Status: DC | PRN

## 2022-11-20 MED ORDER — ONDANSETRON HCL 4 MG PO TABS
4.0000 mg | ORAL_TABLET | Freq: Four times a day (QID) | ORAL | Status: DC | PRN
  Administered 2022-11-20: 4 mg via ORAL
  Filled 2022-11-20: qty 1

## 2022-11-20 MED ORDER — LORAZEPAM 1 MG PO TABS
1.0000 mg | ORAL_TABLET | ORAL | Status: AC | PRN
  Administered 2022-11-20: 1 mg via ORAL
  Administered 2022-11-20: 2 mg via ORAL
  Administered 2022-11-22: 1 mg via ORAL
  Filled 2022-11-20: qty 1
  Filled 2022-11-20: qty 2
  Filled 2022-11-20: qty 1

## 2022-11-20 MED ORDER — OXYCODONE HCL 5 MG PO TABS
5.0000 mg | ORAL_TABLET | Freq: Once | ORAL | Status: AC
  Administered 2022-11-20: 5 mg via ORAL
  Filled 2022-11-20: qty 1

## 2022-11-20 MED ORDER — SODIUM CHLORIDE 0.9 % IV SOLN
50.0000 ug/h | INTRAVENOUS | Status: DC
  Administered 2022-11-20 – 2022-11-24 (×9): 50 ug/h via INTRAVENOUS
  Filled 2022-11-20 (×13): qty 1

## 2022-11-20 MED ORDER — FOLIC ACID 1 MG PO TABS
1.0000 mg | ORAL_TABLET | Freq: Every day | ORAL | Status: DC
  Administered 2022-11-20 – 2022-11-25 (×6): 1 mg via ORAL
  Filled 2022-11-20 (×6): qty 1

## 2022-11-20 MED ORDER — THIAMINE MONONITRATE 100 MG PO TABS
100.0000 mg | ORAL_TABLET | Freq: Every day | ORAL | Status: DC
  Administered 2022-11-21 – 2022-11-25 (×5): 100 mg via ORAL
  Filled 2022-11-20 (×6): qty 1

## 2022-11-20 MED ORDER — THIAMINE HCL 100 MG/ML IJ SOLN
100.0000 mg | Freq: Every day | INTRAMUSCULAR | Status: DC
  Administered 2022-11-20: 100 mg via INTRAVENOUS
  Filled 2022-11-20 (×3): qty 2

## 2022-11-20 MED ORDER — LORAZEPAM 2 MG/ML IJ SOLN
1.0000 mg | INTRAMUSCULAR | Status: AC | PRN
  Administered 2022-11-20: 3 mg via INTRAVENOUS
  Administered 2022-11-21: 2 mg via INTRAVENOUS
  Administered 2022-11-22: 1 mg via INTRAVENOUS
  Filled 2022-11-20: qty 2
  Filled 2022-11-20 (×2): qty 1

## 2022-11-20 MED ORDER — OCTREOTIDE LOAD VIA INFUSION
50.0000 ug | Freq: Once | INTRAVENOUS | Status: AC
  Administered 2022-11-20: 50 ug via INTRAVENOUS
  Filled 2022-11-20: qty 25

## 2022-11-20 MED ORDER — ADULT MULTIVITAMIN W/MINERALS CH
1.0000 | ORAL_TABLET | Freq: Every day | ORAL | Status: DC
  Administered 2022-11-20 – 2022-11-25 (×6): 1 via ORAL
  Filled 2022-11-20 (×6): qty 1

## 2022-11-20 MED ORDER — ONDANSETRON HCL 4 MG/2ML IJ SOLN
4.0000 mg | INTRAMUSCULAR | Status: AC
  Administered 2022-11-20: 4 mg via INTRAVENOUS
  Filled 2022-11-20: qty 2

## 2022-11-20 MED ORDER — IBUPROFEN 400 MG PO TABS: 400.0000 mg | ORAL_TABLET | Freq: Four times a day (QID) | ORAL | Status: DC | PRN

## 2022-11-20 MED ORDER — MORPHINE SULFATE (PF) 4 MG/ML IV SOLN
6.0000 mg | Freq: Once | INTRAVENOUS | Status: AC
  Administered 2022-11-20: 6 mg via INTRAVENOUS
  Filled 2022-11-20: qty 2

## 2022-11-20 MED ORDER — POTASSIUM CHLORIDE 10 MEQ/100ML IV SOLN
10.0000 meq | INTRAVENOUS | Status: AC
  Administered 2022-11-20 (×2): 10 meq via INTRAVENOUS
  Filled 2022-11-20 (×2): qty 100

## 2022-11-20 MED ORDER — ONDANSETRON HCL 4 MG/2ML IJ SOLN
4.0000 mg | Freq: Four times a day (QID) | INTRAMUSCULAR | Status: DC | PRN
  Administered 2022-11-21 – 2022-11-25 (×3): 4 mg via INTRAVENOUS
  Filled 2022-11-20 (×3): qty 2

## 2022-11-20 NOTE — Assessment & Plan Note (Addendum)
Pt reports hx of w/d seizure and ICU admission. --CIWA protocol with PRN Ativan. --Treated with Librium, tapered off --Will need ongoing couseling and support for alcohol cessation given cirrhosis and its complications. --Recommend attending AA meetings --TOC providing list of rehab facilities

## 2022-11-20 NOTE — ED Triage Notes (Signed)
Pt to ED via [facility] from home c/o RUQ abdominal pain, chest pain, dark stools, and vomiting blood. Pt states that he has hx/o cirrhosis, endorses considerable amount of ETOH in the last 24 hours. Pt reports dark stools for the last 2-3 days. Pt states that he started vomiting bright red blood this morning. Pt states that this has happened in the past as well. EMS reports that pt was initially tachycardic at 140, pt was given 200 CC of normal saline with heart rate improving to 110.  Pt c/o 10/10 pain at this time.

## 2022-11-20 NOTE — Assessment & Plan Note (Addendum)
RUQ Abdominal Pain Recent alcohol-induced Gastritis Alcoholic Cirrhosis, Portal Hypertension Hepatic sarcoidosis Suspect variceal vs hemorrhagic gastritis. --Mgmt per Gastroenterology, see their recs --Treated with octreotide drip, IV PPI, ceftriaxone empirically given cirrhosis and GI bleeding --soft diet  --EGD could not be performed due to persistent UDS + for cocaine since admission --GI recommends follow up at [facility]

## 2022-11-20 NOTE — ED Notes (Signed)
Patient requesting something different for pain d/t not being able to take ibuprofen "because of his liver."  NP Foust secure chatted and made aware.

## 2022-11-20 NOTE — Progress Notes (Signed)
TOC consulted for SA resources, patient new to ED 6 hours for 30-40 reports beers yesterday and 2g cocaine. SA resources placed on AVS.   Darolyn Rua, [place], MSW, [place] [phone]

## 2022-11-20 NOTE — ED Notes (Signed)
Pt states "I haven't slept in 4 days."

## 2022-11-20 NOTE — Consult Note (Signed)
GI Inpatient Consult Note  Reason for Consult: Hematemesis/UGIB   Attending Requesting Consult: Dr. Sharyn Creamer   History of Present Illness: William Beasley is a 27 y.o. male seen for evaluation of hematemesis/UGIB at the request of ED physician - Dr. Sharyn Creamer. 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 He presented to the [facility] ED via EMS this morning for chief complaint of RUQ abdominal pain, chest pain, melena, and hematemesis. EMS reports patient was initially tachycardic at 140 and given 200 cc of normal saline with improvement sin HR to 110. Upon presentation to the ED, he was hypertensive (146/95), HR 84, RR 16, and pain level 10/10. Labs significant for hemoglobin 15.4, MCV 91.8, WBC 6.1, platelets 142K, potassium 3.2, sodium 139, BUN 9, serum creatinine 0.61, AST 61, ALT 59, total bilirubin 1.2, and INR 1.3. GI was consulted for further evaluation and management.   Patient seen and examined in ED room this morning. He reports "I feel like I'm about to die." He complains of 10/10 left-sided chest pain and RUQ abdominal pain. He describes the pain as aching, sharp, and stabbing in nature. He is anxious and tremulous. He reports over the past 2-3 days he has been experiencing RUQ abdominal pain with several episodes of black, tarry stools and this morning started to have bright red blood in emesis this morning. He reports he has been drinking excessive EtOH. He reports yesterday with him and 2 of his friends he drank 30-40 beers. He also endorses doing 2 grams of cocaine last night. He reports he started back drinking few months ago due to life events. He was diagnosed with cirrhosis last year after hospitalization at [facility] in Jan 2023 after presenting with coffee-ground emesis. He last saw his [facility] Hepatology team 07/2022. EGD Sept 2023 commented on non-bleeding Grade I EV and mild portal hypertensive  gastropathy. The etiology of his cirrhosis has been deemed 2/2 alcohol with possible interplay with hepatic sarcoidosis s/p liver biopsy. He has known LR-3 lesion on MRI and is overdue for imaging surveillance. He was admitted 3/30 - 11/06/22 for acute alcohol intoxication and UGIB. GI service was not consulted during this admission and he was managed conservatively. Patient is not compliant with his medications at home. He is supposed to be on NSBB but he hasn't been taking this. His hepatology team prescribed naltrexone to help with cravings but he hasn't been taking this. Per nursing, no hematemesis or melena since being in the ED. He reports he lives at home with his mother. He has 2 small children, but is not active in their life anymore and this is why he started back drinking. He works in [employer].   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 No pertinent surgical history.  Allergies: No Known Allergies  Home Medications: (Not in a hospital admission)  Home medication reconciliation was completed with the  patient.   Scheduled Inpatient Medications:    folic acid  1 mg Oral Daily   multivitamin with minerals  1 tablet Oral Daily   thiamine  100 mg Oral Daily   Or   thiamine  100 mg Intravenous Daily    Continuous Inpatient Infusions:    potassium chloride 10 mEq (11/20/22 1108)    PRN Inpatient Medications:  ibuprofen, LORazepam **OR** LORazepam,  ondansetron **OR** ondansetron (ZOFRAN) IV, senna-docusate, sorbitol  Family History: family history is not on file.  The patient's family history is negative for inflammatory bowel disorders, GI malignancy, or solid organ transplantation.  Social History:   reports that he has never smoked. He has never used smokeless tobacco. He reports current alcohol use. He reports that he does not use drugs. The patient denies ETOH, tobacco, or drug use.   Review of Systems: Constitutional: Weight is stable.  Eyes: No changes in vision. ENT: No oral lesions, sore throat.  GI: see HPI.  Heme/Lymph: No easy bruising.  CV: + chest pain  GU: No hematuria.  Integumentary: No rashes.  Neuro: No headaches.  Psych: + depression + anxiety  Endocrine: No heat/cold intolerance.  Allergic/Immunologic: No urticaria.  Resp: No cough, SOB.  Musculoskeletal: No joint swelling.    Physical Examination: BP 137/89   Pulse 99   Resp (!) 21   Ht 5\' 9"  (1.753 m)   Wt 113.4 kg   SpO2 95%   BMI 36.92 kg/m  Gen: NAD, alert and oriented x 4, anxious appearing, appears uncomfortable  HEENT: PEERLA, EOMI, Neck: supple, no JVD or thyromegaly Chest: CTA bilaterally, no wheezes, crackles, or other adventitious sounds CV: RRR, no m/g/c/r Abd: soft, NT, ND, +BS in all four quadrants; no HSM, guarding, ridigity, or rebound tenderness Ext: no edema, well perfused with 2+ pulses, Skin: no rash or lesions noted Lymph: no LAD  Data: Lab Results  Component Value Date   WBC 6.1 11/20/2022   HGB 15.4 11/20/2022   HCT 44.5 11/20/2022   MCV 91.8 11/20/2022   PLT 142 (L) 11/20/2022   Recent Labs  Lab 11/20/22 0936  HGB 15.4   Lab Results  Component Value Date   NA 139 11/20/2022   K 3.2 (L) 11/20/2022   CL 105 11/20/2022   CO2 22 11/20/2022   BUN 9 11/20/2022   CREATININE 0.61 11/20/2022   Lab Results  Component Value Date   ALT 59 (H) 11/20/2022   AST 61 (H) 11/20/2022   ALKPHOS 90 11/20/2022    BILITOT 1.2 11/20/2022   Recent Labs  Lab 11/20/22 0936  INR 1.3*    Assessment/Plan:  27 y/o Hispanic male with a PMH of HTN, anxiety, depression, polysubstance abuse, and 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 GI consulted for further evaluation and management.   Hematemesis/Melena - concerning for UGIB. H&H stable currently with no signs of overt gastrointestinal bleeding. No active bleeding. DDx includes esophageal varices, peptic ulcer disease, gastritis, esophagitis, AVMs, GAVE, neoplasm, polyp, etc.   Hepatic cirrhosis 2/2 EtOH + hepatic sarcoidosis - MELD-Na 11, Child's A (5)  Chest pain - EKG with sinus tachycardia, suspect potential coronary vasospasm 2/2 cocaine abuse versus anxiety   RUQ abdominal pain   Portal hypertension - mild PHG, HSM, Gr I EV, thrombocytopenia   Polysubstance abuse - EtOH and cocaine   Liver  lesion - LR-3, overdue for surveillance   Recommendations:  - Maintain 2 large bore IVs for access - Continue to monitor serial H&H. Transfuse for Hgb <7.0.  - Start Ceftriaxone 1 gram q24h given hx of cirrhosis and presumed GIB - Start Octreotide bolus and gtt for potential esophageal varices bleed - Continue Protonix IV for gastric protection  - Serial examinations. Monitor mental status. CIWA protocol as he is high-risk for withdrawal/DTs - Monitor for signs of overt gastrointestinal bleeding - Check UDS. Suspect this will be + since patient endorses using cocaine last night - EGD when clinically feasible, ideally after cocaine washes out of system (~5 days). If he becomes hemodynamically unstable or has active GI bleeding, EGD will be performed emergently.  - Discussed importance of complete alcohol cessation with patient. If he continues to drink EtOH this will lead to hepatic  decompensation and ultimately death.  - Clear liquids okay for now - GI actively following along with you   Thank you for the consult. Please call Dr. Norma Fredrickson with questions or concerns.  Gilda Crease, PA-C [facility] Gastroenterology [phone]

## 2022-11-20 NOTE — H&P (Addendum)
History and Physical  William RANSFORDrian J Ruotolo ZOX:[id number] DOB: 02-17-1996 DOA: 11/20/2022 DOS: the patient was seen and examined on 11/20/2022 PCP: [facility]  Patient coming from: Home  Chief Complaint:  Chief Complaint  Patient presents with   Abdominal Pain   Blood In Stools   HPI: William Beasley is a 27 y.o. male with medical history significant of alcohol dependence, cirrhosis, sarcoidosis, polysubstance abuse, recent admission 3/30--11/06/2022 for gastritis and alcohol withdrawal, presented to the ED for evaluation of nausea/vomiting with hematemesis this morning.  EMS was called.  They reported pt was initially tachycardic in 140's improved to 110's after IV fluids.  Pt seen in the ED on admission this morning.  He reports 10/10 RUQ abdominal pain, ongoing nausea but not vomiting.  He already has developed significant tremors, scored 17 on CIWA scale.  He endorses history of alcohol withdrawal seizure and Icu admission x 7 days for management of severe withdrawal.  He reports drinking typically a case of beer daily, but drank he thinks 30-40 beers yesterday and used 2 g of cocaine last night.  Pt had been treated with Ativan for withdrawal, and therefore not able to provide much detailed history at time of admission encounter.  He reports "I feel like I'm going to die" and "please don't let me die".    ED course -- BP 148/96, HR 99, RR 18, spO2 96% on room air, afebrile. Labs notable for normal hemoglobin 15.4 (up from 13/9 on 11/04/22), K 3.2, AST 61 and ALT 59 both slightly improved from prior.  CBC was normal except mild thrombocytopenia 142k (up from 41k on 11/04/22).  Patient being admitted to medicine service with Gastroenterology consulted for further evaluation of hematemesis, suspected due to either hemorrhagic gastritis or potentially bleeding esophageal varices.    Review of Systems: unable to review all systems due to the inability of the patient to answer  questions. Past Medical History:  Diagnosis Date   Cirrhosis of liver    History reviewed. No pertinent surgical history. Social History:  reports that he has never smoked. He has never used smokeless tobacco. He reports current alcohol use. He reports that he does not use drugs.  No Known Allergies  History reviewed. No pertinent family history.  Prior to Admission medications   Medication Sig Start Date End Date Taking? Authorizing Provider  carvedilol (COREG) 6.25 MG tablet Take 1 tablet (6.25 mg total) by mouth 2 (two) times daily. 03/14/22   Arnetha Courser, MD  Cholecalciferol 25 MCG (1000 UT) capsule Take 1,000 Units by mouth daily. 07/02/22   [provider]  fluticasone (FLONASE) 50 MCG/ACT nasal spray Place 1 spray into both nostrils daily. 07/04/18 07/04/19  Willy Eddy, MD  folic acid (FOLVITE) 1 MG tablet Take 1 tablet (1 mg total) by mouth daily. 11/07/22 12/07/22  Kirstie Peri, MD  Multiple Vitamin (MULTIVITAMIN WITH MINERALS) TABS tablet Take 1 tablet by mouth daily. 11/07/22 12/07/22  Kirstie Peri, MD  ondansetron (ZOFRAN-ODT) 4 MG disintegrating tablet Take 1 tablet (4 mg total) by mouth every 8 (eight) hours as needed for nausea or vomiting. 11/03/22   Irean Hong, MD  pantoprazole (PROTONIX) 40 MG tablet Take 1 tablet (40 mg total) by mouth 2 (two) times daily. 03/14/22 05/13/22  Arnetha Courser, MD  thiamine (VITAMIN B1) 100 MG tablet Take 1 tablet (100 mg total) by mouth daily. 11/07/22   Kirstie Peri, MD  traMADol (ULTRAM) 50 MG tablet Take 1 tablet (  50 mg total) by mouth every 6 (six) hours as needed. 01/22/22   Candis Schatz, PA-C  traZODone (DESYREL) 50 MG tablet Take 50 mg by mouth at bedtime. 05/15/22   [provider]    Physical Exam: Vitals:   11/20/22 0933 11/20/22 1030 11/20/22 1300 11/20/22 1330  BP:  137/89 (!) 145/85 (!) 144/83  Pulse:  99 93 100  Resp:  (!) 21 (!) 24 (!) 28  SpO2:  95% 95% 94%  Weight: 113.4 kg     Height:   (1.753 m)      General exam: awake, drowsy, no acute distress HEENT: atraumatic, clear conjunctiva, anicteric sclera, moist mucus membranes, hearing grossly normal  Respiratory system: CTAB, no wheezes, rales or rhonchi, normal respiratory effort. Cardiovascular system: normal S1/S2, tachycardic, regular rhythm, no pedal edema.   Gastrointestinal system: soft, non-tender, non-distended, no HSM felt, +bowel sounds. Central nervous system: drowsy but oriented x 3. no gross focal neurologic deficits, normal speech Extremities: moves all , no edema, normal tone Skin: dry, intact, normal temperature Psychiatry: normal mood, congruent affect, judgement and insight appear normal  Data Reviewed:  Notable labs as reviewed above.  Assessment and Plan: * Hematemesis RUQ Abdominal Pain Recent alcohol-induced Gastritis Alcoholic Cirrhosis, Portal Hypertension Hepatic sarcoidosis Suspect variceal vs hemorrhagic gastritis. --Mgmt per Gastroenterology, see their recs --On octreotide drip, IV PPI, ceftriaxone empirically given cirrhosis and GI bleeding --Monitor closely --Clear liquid diet for now, diet per GI --Will need EGD when clinically feasible  Thrombocytopenia Platelets 142k on admission, improved since recent d/c. Due to liver cirrhosis. Monitor CBC  Alcohol dependence with withdrawal CIWA protocol with PRN Ativan. Scheduled Librium 50 mg TID for now. Pt reports hx of w/d seizure and ICU admission. Monitor closely, low threshold to transfer to stepdown unit for Precedex if CIWA scores are escalating. Will need ongoing couseling and support for alcohol cessation given cirrhosis and its complications. Appears was started on Naltrexone, will continue this at d/c if patient is agreeable.  Needs follow up to continue it.      Advance Care Planning: Full Code  Consults: Gastroenterology  Family Communication: None  Severity of Illness: The appropriate patient status for this  patient is OBSERVATION. Observation status is judged to be reasonable and necessary in order to provide the required intensity of service to ensure the patient's safety. The patient's presenting symptoms, physical exam findings, and initial radiographic and laboratory data in the context of their medical condition is felt to place them at decreased risk for further clinical deterioration. Furthermore, it is anticipated that the patient will be medically stable for discharge from the hospital within 2 midnights of admission.   Author: Pennie Banter, DO 11/20/2022 3:08 PM  For on call review [email or web address].

## 2022-11-20 NOTE — Assessment & Plan Note (Addendum)
Platelets 142k on admission >> 69 k today. Due to liver cirrhosis. Monitor CBC

## 2022-11-20 NOTE — ED Provider Notes (Signed)
[facility] Provider Note    Event Date/Time   First MD Initiated Contact with Patient 11/20/22 [phone]     (approximate)   History   Abdominal Pain and Blood In Stools   HPI  William Beasley is a 27 y.o. male with a history of cirrhosis.  Alcoholic gastritis  Patient reports that he had nausea vomiting right upper abdominal pain.  Similar to previous episode just recently.  Reports he drinks somewhere on the order of 24 cans of beer plus hard liquor.  He has seen small amounts of blood in the vomit and also reports his stool has seemed dark to slightly black for the last day.  No fevers or chills.  No chest pain or trouble breathing.  History of cirrhosis     Physical Exam   Triage Vital Signs: ED Triage Vitals  Enc Vitals Group     BP 11/20/22 0932 (!) 148/96     Pulse Rate 11/20/22 0932 99     Resp 11/20/22 0932 18     Temp --      Temp src --      SpO2 11/20/22 0932 96 %     Weight 11/20/22 0933 250 lb (113.4 kg)     Height 11/20/22 0933  (1.753 m)     Head Circumference --      Peak Flow --      Pain Score 11/20/22 0933 10     Pain Loc --      Pain Edu? --      Excl. in GC? --     Most recent vital signs: Vitals:   11/24/22 0716 11/24/22 1559  BP: 117/72 130/88  Pulse: 61 68  Resp: 16 18  Temp: 97.9 F (36.6 C) 97.8 F (36.6 C)  SpO2: 97% 99%     General: Awake, no distress.  Some dry heaving but no hematemesis CV:  Good peripheral perfusion.  Resp:  Normal effort.  Abd:  No distention.  Soft no focal tenderness.  He reports some pain in his epigastrium and right upper quadrant, but no specific findings such as rebound guarding or evidence of acute abdomen Other:     ED Results / Procedures / Treatments   Labs (all labs ordered are listed, but only abnormal results are displayed) Labs Reviewed  COMPREHENSIVE METABOLIC PANEL - Abnormal; Notable for the following components:      Result Value   Potassium 3.2 (*)     Glucose, Bld 114 (*)    Calcium 8.6 (*)    Total Protein 8.4 (*)    AST 61 (*)    ALT 59 (*)    All other components within normal limits  CBC - Abnormal; Notable for the following components:   Platelets 142 (*)    All other components within normal limits  URINALYSIS, ROUTINE W REFLEX MICROSCOPIC - Abnormal; Notable for the following components:   Color, Urine YELLOW (*)    APPearance CLEAR (*)    Ketones, ur 5 (*)    All other components within normal limits  PROTIME-INR - Abnormal; Notable for the following components:   Prothrombin Time 15.6 (*)    INR 1.3 (*)    All other components within normal limits  COMPREHENSIVE METABOLIC PANEL - Abnormal; Notable for the following components:   Glucose, Bld 112 (*)    AST 49 (*)    ALT 47 (*)    Total Bilirubin 2.0 (*)  All other components within normal limits  CBC - Abnormal; Notable for the following components:   Platelets 83 (*)    All other components within normal limits  URINE DRUG SCREEN, QUALITATIVE ([facility] ONLY) - Abnormal; Notable for the following components:   Cocaine Metabolite,Ur [place] POSITIVE (*)    Opiate, Ur Screen POSITIVE (*)    Cannabinoid 50 Ng, Ur [place] POSITIVE (*)    Benzodiazepine, Ur Scrn POSITIVE (*)    All other components within normal limits  COMPREHENSIVE METABOLIC PANEL - Abnormal; Notable for the following components:   Glucose, Bld 121 (*)    Calcium 8.7 (*)    Albumin 3.4 (*)    AST 46 (*)    ALT 45 (*)    All other components within normal limits  CBC - Abnormal; Notable for the following components:   WBC 3.3 (*)    Platelets 69 (*)    All other components within normal limits  URINE DRUG SCREEN, QUALITATIVE ([facility] ONLY) - Abnormal; Notable for the following components:   Cocaine Metabolite,Ur [place] POSITIVE (*)    Cannabinoid 50 Ng, Ur [place] POSITIVE (*)    Benzodiazepine, Ur Scrn POSITIVE (*)    All other components within normal limits  CBC - Abnormal; Notable for the following components:    WBC 3.3 (*)    Platelets 82 (*)    All other components within normal limits  BASIC METABOLIC PANEL - Abnormal; Notable for the following components:   Glucose, Bld 111 (*)    Creatinine, Ser 0.59 (*)    Calcium 8.4 (*)    All other components within normal limits  URINE DRUG SCREEN, QUALITATIVE ([facility] ONLY) - Abnormal; Notable for the following components:   Cocaine Metabolite,Ur [place] POSITIVE (*)    Opiate, Ur Screen POSITIVE (*)    Cannabinoid 50 Ng, Ur [place] POSITIVE (*)    Benzodiazepine, Ur Scrn POSITIVE (*)    All other components within normal limits  CBC - Abnormal; Notable for the following components:   RDW 15.6 (*)    Platelets 75 (*)    All other components within normal limits  LIPASE, BLOOD  MAGNESIUM  MAGNESIUM  PHOSPHORUS  MAGNESIUM  PHOSPHORUS  TYPE AND SCREEN     EKG  Interpreted at 935 heart rate 100 QRS 90 QTc 440 Sinus tachycardia, mild repolarization abnormality   RADIOLOGY     PROCEDURES:  Critical Care performed: No  Procedures    IMPRESSION / MDM / ASSESSMENT AND PLAN / ED COURSE  I reviewed the triage vital signs and the nursing notes.                              Differential diagnosis includes, but is not limited to, alcoholic gastritis, peptic ulcer disease, esophagitis, variceal bleeding, coagulopathy, cirrhosis, gastroenteritis, etc.  No evidence of acute abdomen that would be high suggestive acute obstruction perforation or need for emergent CT imaging.  Based on the clinical history provided at this time, discussed case with gastroenterology, Dr. Norma Fredrickson, who will provide consultation on the patient today, discussed with the patient provide antiemetic fluids, pain control.  His hemoglobin is stable.  He has no evidence of active hematemesis at this time.  He does however provide a concerning history and has a documented history of alcoholic liver disease, cirrhosis and he represents a high risk for potential decompensation or  worsening.  Consulted with our hospitalist Dr. Denton Lank who will  admit  Patient's presentation is most consistent with acute complicated illness / injury requiring diagnostic workup.   The patient is on the cardiac monitor to evaluate for evidence of arrhythmia and/or significant heart rate changes.  FINAL CLINICAL IMPRESSION(S) / ED DIAGNOSES   Final diagnoses:  Nausea and vomiting, unspecified vomiting type     Rx / DC Orders   ED Discharge Orders     None        Note:  This document was prepared using Dragon voice recognition software and may include unintentional dictation errors.   Sharyn Creamer, MD 11/24/22 [phone]

## 2022-11-20 NOTE — Discharge Instructions (Signed)
Intensive Outpatient Programs   [facility] [address] [place], [place] [phone]  [facility] (Inpatient and outpatient)[phone] (Suboxone and Methadone) [address] [phone]  [facility] - Intensive Outpatient [address] [place], [place] [address], [place]  [phone]  [facility] (Outpatient, Inpatient, [facility] (Groups and Residental) (insurance only) [phone], [place] [phone]   [facility] (for caregivers and family) [address] [place], [place] [phone]  Residential Treatment Programs  [facility](2 years) Residential: 90 days)[facility] [address] [place], [place], [place] [phone] or [phone]  [facility] [address] [place] [phone]  [facility] [address] [place], [place] [address], [place] [phone] Admissions: 8am-3pm M-F  [facility]: Residential Program (90 Days)             [facility]: [facility]  [place], [place]  [phone] or [phone](Walk in Hours over the weekend or by referral)   Mobil Crisis: [facility]:[phone] (for crisis response 24 hours a day)       Transportation Agency Name: [facility] Address: [address] [place], [place] [address] Phone: [phone] Email:  troper38@bells[place].net Website: www.alamanceservices.org Service(s) Offered: Housing services, self-sufficiency, congregate meal  program, weatherization program, heating appliance  repair/replacement program, emergency food assistance,  housing counseling, home ownership program, wheels-towork program. November 28, 2016 22  Agency Name: [place] [employer] ([employer]) Address: [address], [place], [place] [address] Phone: [phone] Email:  Website: www.acta-[place].com Service(s) Offered: Transportation for general public, subscription and demand  response; Dial-a-Ride for citizens 60 years of age or older. Agency Name: Department of Social Services Address: [address], [place],  [address] Phone: [phone] Service(s) Offered: Child support services; child welfare services; food stamps;  Medicaid; work first family assistance; and aid with fuel,  rent, food and medicine, transportation assistance.  Agency Name: [employer] ([employer]) [employer]  Network Address: Phone: [phone] Service(s) Offered: Transports veterans to the [facility]. Call  forty-eight hours in advance and leave the name, telephone  number, date, and time of appointment. Veteran will be  contacted by the driver the day before the appointment to  arrange a pick up point    

## 2022-11-21 DIAGNOSIS — Z79899 Other long term (current) drug therapy: Secondary | ICD-10-CM | POA: Diagnosis not present

## 2022-11-21 DIAGNOSIS — F1410 Cocaine abuse, uncomplicated: Secondary | ICD-10-CM | POA: Diagnosis present

## 2022-11-21 DIAGNOSIS — F10230 Alcohol dependence with withdrawal, uncomplicated: Secondary | ICD-10-CM | POA: Diagnosis not present

## 2022-11-21 DIAGNOSIS — F1910 Other psychoactive substance abuse, uncomplicated: Secondary | ICD-10-CM

## 2022-11-21 DIAGNOSIS — K7030 Alcoholic cirrhosis of liver without ascites: Secondary | ICD-10-CM | POA: Diagnosis present

## 2022-11-21 DIAGNOSIS — K2921 Alcoholic gastritis with bleeding: Secondary | ICD-10-CM | POA: Diagnosis present

## 2022-11-21 DIAGNOSIS — Z91148 Patient's other noncompliance with medication regimen for other reason: Secondary | ICD-10-CM | POA: Diagnosis not present

## 2022-11-21 DIAGNOSIS — K3189 Other diseases of stomach and duodenum: Secondary | ICD-10-CM | POA: Diagnosis present

## 2022-11-21 DIAGNOSIS — D696 Thrombocytopenia, unspecified: Secondary | ICD-10-CM | POA: Diagnosis present

## 2022-11-21 DIAGNOSIS — I10 Essential (primary) hypertension: Secondary | ICD-10-CM | POA: Diagnosis present

## 2022-11-21 DIAGNOSIS — D8689 Sarcoidosis of other sites: Secondary | ICD-10-CM | POA: Diagnosis present

## 2022-11-21 DIAGNOSIS — F32A Depression, unspecified: Secondary | ICD-10-CM | POA: Diagnosis present

## 2022-11-21 DIAGNOSIS — F419 Anxiety disorder, unspecified: Secondary | ICD-10-CM | POA: Diagnosis present

## 2022-11-21 DIAGNOSIS — R1011 Right upper quadrant pain: Secondary | ICD-10-CM | POA: Diagnosis present

## 2022-11-21 DIAGNOSIS — K766 Portal hypertension: Secondary | ICD-10-CM | POA: Diagnosis present

## 2022-11-21 DIAGNOSIS — F10239 Alcohol dependence with withdrawal, unspecified: Secondary | ICD-10-CM | POA: Diagnosis present

## 2022-11-21 LAB — CBC
HCT: 40.3 % (ref 39.0–52.0)
Hemoglobin: 13.5 g/dL (ref 13.0–17.0)
MCH: 32 pg (ref 26.0–34.0)
MCHC: 33.5 g/dL (ref 30.0–36.0)
MCV: 95.5 fL (ref 80.0–100.0)
Platelets: 83 10*3/uL — ABNORMAL LOW (ref 150–400)
RBC: 4.22 MIL/uL (ref 4.22–5.81)
RDW: 15.2 % (ref 11.5–15.5)
WBC: 4.4 10*3/uL (ref 4.0–10.5)
nRBC: 0 % (ref 0.0–0.2)

## 2022-11-21 LAB — COMPREHENSIVE METABOLIC PANEL
ALT: 47 U/L — ABNORMAL HIGH (ref 0–44)
AST: 49 U/L — ABNORMAL HIGH (ref 15–41)
Albumin: 3.5 g/dL (ref 3.5–5.0)
Alkaline Phosphatase: 80 U/L (ref 38–126)
Anion gap: 8 (ref 5–15)
BUN: 10 mg/dL (ref 6–20)
CO2: 27 mmol/L (ref 22–32)
Calcium: 9 mg/dL (ref 8.9–10.3)
Chloride: 101 mmol/L (ref 98–111)
Creatinine, Ser: 0.63 mg/dL (ref 0.61–1.24)
GFR, Estimated: >60 mL/min (ref >60)
Glucose, Bld: 112 mg/dL — ABNORMAL HIGH (ref 70–99)
Potassium: 3.9 mmol/L (ref 3.5–5.1)
Sodium: 136 mmol/L (ref 135–145)
Total Bilirubin: 2 mg/dL — ABNORMAL HIGH (ref 0.3–1.2)
Total Protein: 7.4 g/dL (ref 6.5–8.1)

## 2022-11-21 LAB — PHOSPHORUS: Phosphorus: 3 mg/dL (ref 2.5–4.6)

## 2022-11-21 LAB — MAGNESIUM: Magnesium: 2 mg/dL (ref 1.7–2.4)

## 2022-11-21 MED ORDER — PANTOPRAZOLE SODIUM 40 MG IV SOLR
40.0000 mg | Freq: Two times a day (BID) | INTRAVENOUS | Status: DC
  Administered 2022-11-21 – 2022-11-25 (×10): 40 mg via INTRAVENOUS
  Filled 2022-11-21 (×10): qty 10

## 2022-11-21 MED ORDER — CHLORDIAZEPOXIDE HCL 25 MG PO CAPS
25.0000 mg | ORAL_CAPSULE | Freq: Three times a day (TID) | ORAL | Status: DC
  Administered 2022-11-21 – 2022-11-24 (×11): 25 mg via ORAL
  Filled 2022-11-21 (×12): qty 1

## 2022-11-21 MED ORDER — OXYCODONE HCL 5 MG PO TABS
5.0000 mg | ORAL_TABLET | Freq: Once | ORAL | Status: AC
  Administered 2022-11-21: 5 mg via ORAL
  Filled 2022-11-21 (×2): qty 1

## 2022-11-21 MED ORDER — IBUPROFEN 400 MG PO TABS: 400.0000 mg | ORAL_TABLET | Freq: Four times a day (QID) | ORAL | Status: DC | PRN

## 2022-11-21 MED ORDER — STERILE WATER FOR INJECTION IJ SOLN
INTRAMUSCULAR | Status: AC
  Filled 2022-11-21: qty 10

## 2022-11-21 NOTE — Hospital Course (Signed)
HPI on admission: "William Beasley is a 27 y.o. male with medical history significant of alcohol dependence, cirrhosis, sarcoidosis, polysubstance abuse, recent admission 3/30--11/06/2022 for gastritis and alcohol withdrawal, presented to the ED for evaluation of nausea/vomiting with hematemesis this morning.  EMS was called.  They reported pt was initially tachycardic in 140's improved to 110's after IV fluids.  Pt seen in the ED on admission this morning.  He reports 10/10 RUQ abdominal pain, ongoing nausea but not vomiting.  He already has developed significant tremors, scored 17 on CIWA scale.  He endorses history of alcohol withdrawal seizure and Icu admission x 7 days for management of severe withdrawal.  He reports drinking typically a case of beer daily, but drank he thinks 30-40 beers yesterday and used 2 g of cocaine last night.  Pt had been treated with Ativan for withdrawal, and therefore not able to provide much detailed history at time of admission encounter.  He reports "I feel like I'm going to die" and "please don't let me die".     ED course -- BP 148/96, HR 99, RR 18, spO2 96% on room air, afebrile. Labs notable for normal hemoglobin 15.4 (up from 13/9 on 11/04/22), K 3.2, AST 61 and ALT 59 both slightly improved from prior.  CBC was normal except mild thrombocytopenia 142k (up from 41k on 11/04/22)."   Admitted to medicine service with gastroenterology consulted.  EGD cannot be performed until UDS negative for cocaine.

## 2022-11-21 NOTE — ED Notes (Signed)
CIWA at a 9, Librium given. MD aware and said to wait on the Ativan. Will continue to monitor.

## 2022-11-21 NOTE — Assessment & Plan Note (Signed)
POA.   Supportive care with anti-emetics, IV PPI GI is following.  EGD delayed due to +cocaine.

## 2022-11-21 NOTE — Progress Notes (Signed)
Progress Note   Patient: William Beasley NFA:[id number] DOB: 1996/07/27 DOA: 11/20/2022     0 DOS: the patient was seen and examined on 11/21/2022   Brief hospital course: HPI on admission: "William Beasley is a 27 y.o. male with medical history significant of alcohol dependence, cirrhosis, sarcoidosis, polysubstance abuse, recent admission 3/30--11/06/2022 for gastritis and alcohol withdrawal, presented to the ED for evaluation of nausea/vomiting with hematemesis this morning.  EMS was called.  They reported pt was initially tachycardic in 140's improved to 110's after IV fluids.  Pt seen in the ED on admission this morning.  He reports 10/10 RUQ abdominal pain, ongoing nausea but not vomiting.  He already has developed significant tremors, scored 17 on CIWA scale.  He endorses history of alcohol withdrawal seizure and Icu admission x 7 days for management of severe withdrawal.  He reports drinking typically a case of beer daily, but drank he thinks 30-40 beers yesterday and used 2 g of cocaine last night.  Pt had been treated with Ativan for withdrawal, and therefore not able to provide much detailed history at time of admission encounter.  He reports "I feel like I'm going to die" and "please don't let me die".     ED course -- BP 148/96, HR 99, RR 18, spO2 96% on room air, afebrile. Labs notable for normal hemoglobin 15.4 (up from 13/9 on 11/04/22), K 3.2, AST 61 and ALT 59 both slightly improved from prior.  CBC was normal except mild thrombocytopenia 142k (up from 41k on 11/04/22)."   Admitted to medicine service with gastroenterology consulted.  EGD cannot be performed until UDS negative for cocaine.    Assessment and Plan: * Hematemesis RUQ Abdominal Pain Recent alcohol-induced Gastritis Alcoholic Cirrhosis, Portal Hypertension Hepatic sarcoidosis Suspect variceal vs hemorrhagic gastritis. --Mgmt per Gastroenterology, see their recs --On octreotide drip, IV PPI, ceftriaxone empirically given  cirrhosis and GI bleeding --Monitor closely --soft diet for now, diet per GI --Will need EGD when clinically feasible and negative for cocaine  Alcohol dependence with withdrawal Pt reports hx of w/d seizure and ICU admission. --CIWA protocol with PRN Ativan. --Reduce Librium to 25 mg TID and taper as withdrawal symptoms improve. --Monitor closely, low threshold to transfer to stepdown unit for Precedex if CIWA scores are escalating. --Will need ongoing couseling and support for alcohol cessation given cirrhosis and its complications. --Appears was started on Naltrexone, will continue this at d/c if patient is agreeable.  Needs follow up to continue it.  Polysubstance abuse UDS + for Cocaine, Benzo's, cannabinoids, opidates  RUQ abdominal pain Non-acute CT abdomen/pelvis. Supportive care for gastritis as outlined  Thrombocytopenia Platelets 142k on admission, improved since recent d/c. Due to liver cirrhosis. Monitor CBC  Alcoholic gastritis POA.   Supportive care with anti-emetics, IV PPI GI is following.  EGD delayed due to +cocaine.  Liver cirrhosis Due to alcohol abuse Monitor LFT's        Subjective: Pt seen in the ED, still holding for a bed this AM.  He says he can barely keep his eyes open.  Endorses ongoing RUQ pain and nausea.  No other acute complaints.  He falls back to sleep immediately.   Physical Exam: Vitals:   11/21/22 0800 11/21/22 0830 11/21/22 0900 11/21/22 1042  BP: 128/74 121/75  134/83  Pulse: 82 82 88 82  Resp: Temp:    97.6 F (36.4 C)  TempSrc:    Oral  SpO2: 95% 95%  93%  Weight:    107.6 kg  Height:     (1.753 m)   General exam: awake, very drowsy appearing, no acute distress, obese HEENT: moist mucus membranes, hearing grossly normal  Respiratory system: CTAB, no wheezes, rales or rhonchi, normal respiratory effort. Cardiovascular system: normal S1/S2,  RRR, no JVD, murmurs, rubs, gallops, no pedal edema.    Gastrointestinal system: soft, NT, ND, no HSM felt, +bowel sounds. Central nervous system: no gross focal neurologic deficits, normal speech Extremities: moves all, no edema, normal tone Skin: dry, intact, normal temperature Psychiatry: normal mood, congruent affect, judgement and insight appear normal   Data Reviewed:  Notable labs ---  LFT's improved AST 49, ALT 47, glucose 112, T bili 2.0.  platelets 142 >> 83k  Family Communication: None  Disposition: Status is: Inpatient Remains inpatient appropriate because: Severity of illness as outlined, remains on IV therapies and evaluation still ongoing   Planned Discharge Destination: Home    Time spent: 38 minutes  Author: Pennie Banter, DO 11/21/2022 2:16 PM  For on call review [email or web address].

## 2022-11-21 NOTE — Progress Notes (Signed)
[facility] Gastroenterology Inpatient Progress Note    Subjective: Patient seen for f/u hemetemesis. No recurrent nausea or vomiting.  Wants to go home, but says he will stay for endoscopy. Tolerating clear liquid diet.  Objective: Vital signs in last 24 hours: Temp:  [97.6 F (36.4 C)-98.9 F (37.2 C)] 97.6 F (36.4 C) (04/18 1042) Pulse Rate:  [78-108] 82 (04/18 1042) Resp:  [13-27] 17 (04/18 1042) BP: (119-155)/(62-103) 134/83 (04/18 1042) SpO2:  [93 %-97 %] 93 % (04/18 1042) Weight:  [107.6 kg] 107.6 kg (04/18 1042) Blood pressure 134/83, pulse 82, temperature 97.6 F (36.4 C), temperature source Oral, resp. rate 17, height  (1.753 m), weight 107.6 kg, SpO2 93 %.    Intake/Output from previous day: No intake/output data recorded.  Intake/Output this shift: Total I/O In: 120 [P.O.:120] Out: -    Gen: NAD. Appears comfortable.  HEENT: Walker/AT. PERRLA. Normal external ear exam.  Chest: CTA, no wheezes.  CV: RR nl S1, S2. No gallops.  Abd: soft, nt, nd. BS+  Ext: no edema. Pulses 2+  Neuro: Alert and oriented. Judgement appears normal. Nonfocal.   Lab Results: Results for orders placed or performed during the hospital encounter of 11/20/22 (from the past 24 hour(s))  Urinalysis, Routine w reflex microscopic -Urine, Clean Catch     Status: Abnormal   Collection Time: 11/20/22  9:45 PM  Result Value Ref Range   Color, Urine YELLOW (A) YELLOW   APPearance CLEAR (A) CLEAR   Specific Gravity, Urine 1.017 1.005 - 1.030   pH 7.0 5.0 - 8.0   Glucose, UA NEGATIVE NEGATIVE mg/dL   Hgb urine dipstick NEGATIVE NEGATIVE   Bilirubin Urine NEGATIVE NEGATIVE   Ketones, ur 5 (A) NEGATIVE mg/dL   Protein, ur NEGATIVE NEGATIVE mg/dL   Nitrite NEGATIVE NEGATIVE   Leukocytes,Ua NEGATIVE NEGATIVE  Urine Drug Screen, Qualitative ([facility] only)     Status: Abnormal   Collection Time: 11/20/22  9:45 PM  Result Value Ref Range   Tricyclic, Ur Screen NONE DETECTED NONE  DETECTED   Amphetamines, Ur Screen NONE DETECTED NONE DETECTED   MDMA (Ecstasy)Ur Screen NONE DETECTED NONE DETECTED   Cocaine Metabolite,Ur [place] POSITIVE (A) NONE DETECTED   Opiate, Ur Screen POSITIVE (A) NONE DETECTED   Phencyclidine (PCP) Ur S NONE DETECTED NONE DETECTED   Cannabinoid 50 Ng, Ur [place] POSITIVE (A) NONE DETECTED   Barbiturates, Ur Screen NONE DETECTED NONE DETECTED   Benzodiazepine, Ur Scrn POSITIVE (A) NONE DETECTED   Methadone Scn, Ur NONE DETECTED NONE DETECTED  Comprehensive metabolic panel     Status: Abnormal   Collection Time: 11/21/22  5:42 AM  Result Value Ref Range   Sodium 136 135 - 145 mmol/L   Potassium 3.9 3.5 - 5.1 mmol/L   Chloride 101 98 - 111 mmol/L   CO2 27 22 - 32 mmol/L   Glucose, Bld 112 (H) 70 - 99 mg/dL   BUN 10 6 - 20 mg/dL   Creatinine, Ser 1.61 0.61 - 1.24 mg/dL   Calcium 9.0 8.9 - 09.6 mg/dL   Total Protein 7.4 6.5 - 8.1 g/dL   Albumin 3.5 3.5 - 5.0 g/dL   AST 49 (H) 15 - 41 U/L   ALT 47 (H) 0 - 44 U/L   Alkaline Phosphatase 80 38 - 126 U/L   Total Bilirubin 2.0 (H) 0.3 - 1.2 mg/dL   GFR, Estimated >04 >54 mL/min   Anion gap 8 5 - 15  CBC  Status: Abnormal   Collection Time: 11/21/22  5:42 AM  Result Value Ref Range   WBC 4.4 4.0 - 10.5 K/uL   RBC 4.22 4.22 - 5.81 MIL/uL   Hemoglobin 13.5 13.0 - 17.0 g/dL   HCT 96.0 45.4 - 09.8 %   MCV 95.5 80.0 - 100.0 fL   MCH 32.0 26.0 - 34.0 pg   MCHC 33.5 30.0 - 36.0 g/dL   RDW 11.9 14.7 - 82.9 %   Platelets 83 (L) 150 - 400 K/uL   nRBC 0.0 0.0 - 0.2 %  Magnesium     Status: None   Collection Time: 11/21/22  5:42 AM  Result Value Ref Range   Magnesium 2.0 1.7 - 2.4 mg/dL  Phosphorus     Status: None   Collection Time: 11/21/22  5:42 AM  Result Value Ref Range   Phosphorus 3.0 2.5 - 4.6 mg/dL     Recent Labs    56/21/30 0936 11/21/22 0542  WBC 6.1 4.4  HGB 15.4 13.5  HCT 44.5 40.3  PLT 142* 83*   BMET Recent Labs    11/20/22 0936 11/21/22 0542  NA 139 136  K 3.2* 3.9   CL 105 101  CO2 22 27  GLUCOSE 114* 112*  BUN 9 10  CREATININE 0.61 0.63  CALCIUM 8.6* 9.0   LFT Recent Labs    11/21/22 0542  PROT 7.4  ALBUMIN 3.5  AST 49*  ALT 47*  ALKPHOS 80  BILITOT 2.0*   PT/INR Recent Labs    11/20/22 0936  LABPROT 15.6*  INR 1.3*   Hepatitis Panel No results for input(s): "HEPBSAG", "HCVAB", "HEPAIGM", "HEPBIGM" in the last 72 hours. C-Diff No results for input(s): "CDIFFTOX" in the last 72 hours. No results for input(s): "CDIFFPCR" in the last 72 hours.   Studies/Results: No results found.  Scheduled Inpatient Medications:    chlordiazePOXIDE  25 mg Oral TID   folic acid  1 mg Oral Daily   multivitamin with minerals  1 tablet Oral Daily   pantoprazole (PROTONIX) IV  40 mg Intravenous Q12H   thiamine  100 mg Oral Daily   Or   thiamine  100 mg Intravenous Daily    Continuous Inpatient Infusions:    cefTRIAXone (ROCEPHIN)  IV 2 g (11/21/22 1250)   octreotide (SANDOSTATIN) 500 mcg in sodium chloride 0.9 % 250 mL (2 mcg/mL) infusion 50 mcg/hr (11/21/22 0859)    PRN Inpatient Medications:  ibuprofen, LORazepam **OR** LORazepam, ondansetron **OR** ondansetron (ZOFRAN) IV, prochlorperazine, senna-docusate, sorbitol  Miscellaneous: N/A  Assessment:  Hemetemesis - Historically seems like Mallory Weiss tear, but given cirrhosis history cannot rule out esophageal variceal bleeding. On octreotide. H/H stable (Hgb 14) Hx Grade I Esophageal varices Alcoholic cirrhosis - Meld score of 11. Sarcoidosis. Polysubstance abuse - COCAINE + UDS yesterday.  Plan:  Diet as tolerated. EGD when clinically feasible only after Cocaine is negative on UDS or patient has need for emergent therapy endoscopically. Continuing to follow.  Hula Tasso K. Norma Fredrickson, M.D. 11/21/2022, 2:52 PM

## 2022-11-21 NOTE — Progress Notes (Signed)
       CROSS COVER NOTE  NAME: MAXX PHAM MRN: [id number] DOB : 1996-02-07    HPI/Events of Note   Report:Informed by nurse patient wanted to leave agaist medical advice  On review of chart: patient with history of alcohol dependence, cirrhosis sarcoidosis, Grade I esophageal varices. polysubstance abuse sarcoidosis, and recent admission for gastritis  admitted now with hematemesis and concern for esophageal variceal bleeding on withdrawal protocol and octreatide infusion    Assessment and  Interventions   Assessment: Seen patient in his room with significant other, nurse Dawn  and child. Discussed with him the concerns for bleeding, purpose of octreatide and need for him to stay for heeling and risk of hemorrhage and or death with esophageal varices rupture. Patient understood and will sty here for treatment He was tearful and shaky, complained of severe abdominal pain and requested medication as well as medication for his nerves Plan: Oxycodone 5 mg x1 Nurse instructed to dose ativan per current CIWA score X X      Donnie Mesa NP [facility]

## 2022-11-21 NOTE — Assessment & Plan Note (Signed)
Due to alcohol abuse Monitor LFT's

## 2022-11-21 NOTE — TOC CM/SW Note (Signed)
SDOH flag for transportation barriers. Resources added to AVS.  Charlynn Court, CSW [phone]

## 2022-11-21 NOTE — ED Notes (Signed)
Patient ambulated to restroom with steady gait.

## 2022-11-21 NOTE — Assessment & Plan Note (Addendum)
UDS + for Cocaine, Benzo's, cannabinoids, opiates.  Also drinks heavily, approx a case of beer daily.  Self-medicates for unaddressed mental health issues. --TOC consulted to provide resources including list of rehab facilities

## 2022-11-21 NOTE — Assessment & Plan Note (Signed)
Non-acute CT abdomen/pelvis. Supportive care for gastritis as outlined

## 2022-11-22 ENCOUNTER — Encounter: Payer: Self-pay | Admitting: Certified Registered"

## 2022-11-22 DIAGNOSIS — F32A Depression, unspecified: Secondary | ICD-10-CM | POA: Diagnosis present

## 2022-11-22 DIAGNOSIS — K2921 Alcoholic gastritis with bleeding: Secondary | ICD-10-CM | POA: Diagnosis not present

## 2022-11-22 DIAGNOSIS — F419 Anxiety disorder, unspecified: Secondary | ICD-10-CM | POA: Diagnosis present

## 2022-11-22 DIAGNOSIS — F10230 Alcohol dependence with withdrawal, uncomplicated: Secondary | ICD-10-CM | POA: Diagnosis not present

## 2022-11-22 DIAGNOSIS — D696 Thrombocytopenia, unspecified: Secondary | ICD-10-CM | POA: Diagnosis not present

## 2022-11-22 LAB — URINE DRUG SCREEN, QUALITATIVE (ARMC ONLY)
Amphetamines, Ur Screen: NOT DETECTED
Barbiturates, Ur Screen: NOT DETECTED
Benzodiazepine, Ur Scrn: POSITIVE — AB
Cannabinoid 50 Ng, Ur ~~LOC~~: POSITIVE — AB
Cocaine Metabolite,Ur ~~LOC~~: POSITIVE — AB
MDMA (Ecstasy)Ur Screen: NOT DETECTED
Methadone Scn, Ur: NOT DETECTED
Opiate, Ur Screen: NOT DETECTED
Phencyclidine (PCP) Ur S: NOT DETECTED
Tricyclic, Ur Screen: NOT DETECTED

## 2022-11-22 LAB — COMPREHENSIVE METABOLIC PANEL
ALT: 45 U/L — ABNORMAL HIGH (ref 0–44)
AST: 46 U/L — ABNORMAL HIGH (ref 15–41)
Albumin: 3.4 g/dL — ABNORMAL LOW (ref 3.5–5.0)
Alkaline Phosphatase: 77 U/L (ref 38–126)
Anion gap: 8 (ref 5–15)
BUN: 8 mg/dL (ref 6–20)
CO2: 27 mmol/L (ref 22–32)
Calcium: 8.7 mg/dL — ABNORMAL LOW (ref 8.9–10.3)
Chloride: 103 mmol/L (ref 98–111)
Creatinine, Ser: 0.69 mg/dL (ref 0.61–1.24)
GFR, Estimated: >60 mL/min (ref >60)
Glucose, Bld: 121 mg/dL — ABNORMAL HIGH (ref 70–99)
Potassium: 3.5 mmol/L (ref 3.5–5.1)
Sodium: 138 mmol/L (ref 135–145)
Total Bilirubin: 1.1 mg/dL (ref 0.3–1.2)
Total Protein: 7.3 g/dL (ref 6.5–8.1)

## 2022-11-22 LAB — CBC
HCT: 40.8 % (ref 39.0–52.0)
Hemoglobin: 13.8 g/dL (ref 13.0–17.0)
MCH: 31.8 pg (ref 26.0–34.0)
MCHC: 33.8 g/dL (ref 30.0–36.0)
MCV: 94 fL (ref 80.0–100.0)
Platelets: 69 10*3/uL — ABNORMAL LOW (ref 150–400)
RBC: 4.34 MIL/uL (ref 4.22–5.81)
RDW: 15 % (ref 11.5–15.5)
WBC: 3.3 10*3/uL — ABNORMAL LOW (ref 4.0–10.5)
nRBC: 0 % (ref 0.0–0.2)

## 2022-11-22 MED ORDER — ESCITALOPRAM OXALATE 10 MG PO TABS: 10.0000 mg | ORAL_TABLET | Freq: Every day | ORAL | Status: DC

## 2022-11-22 MED ORDER — ESCITALOPRAM OXALATE 10 MG PO TABS
10.0000 mg | ORAL_TABLET | Freq: Every day | ORAL | Status: DC
  Administered 2022-11-23 – 2022-11-25 (×3): 10 mg via ORAL
  Filled 2022-11-22 (×3): qty 1

## 2022-11-22 MED ORDER — KETOROLAC TROMETHAMINE 30 MG/ML IJ SOLN
15.0000 mg | Freq: Four times a day (QID) | INTRAMUSCULAR | Status: DC | PRN
  Administered 2022-11-22 (×2): 15 mg via INTRAVENOUS
  Filled 2022-11-22 (×2): qty 1

## 2022-11-22 MED ORDER — ENSURE MAX PROTEIN PO LIQD
11.0000 [oz_av] | Freq: Two times a day (BID) | ORAL | Status: DC
  Administered 2022-11-22 – 2022-11-25 (×7): 11 [oz_av] via ORAL

## 2022-11-22 MED ORDER — TRAMADOL HCL 50 MG PO TABS
50.0000 mg | ORAL_TABLET | Freq: Four times a day (QID) | ORAL | Status: DC | PRN
  Administered 2022-11-22 – 2022-11-23 (×2): 50 mg via ORAL
  Filled 2022-11-22 (×3): qty 1

## 2022-11-22 NOTE — Progress Notes (Signed)
GI Inpatient Follow-up Note  Subjective:  Patient seen in follow-up for hematemesis and melena. No acute events overnight. No signs of recurrent hematemesis or melena. He continues to complain of abdominal pain and is receiving oxycodone. He denies any nausea, vomiting, chest pain, heartburn, reflux, or rectal bleeding. He is tolerating soft diet. He has had several bowel movements this morning and last one was brown. UDS still positive for cocaine. Hemoglobin 13.8 this morning.   Scheduled Inpatient Medications:   chlordiazePOXIDE  25 mg Oral TID   folic acid  1 mg Oral Daily   multivitamin with minerals  1 tablet Oral Daily   pantoprazole (PROTONIX) IV  40 mg Intravenous Q12H   thiamine  100 mg Oral Daily   Or   thiamine  100 mg Intravenous Daily    Continuous Inpatient Infusions:    cefTRIAXone (ROCEPHIN)  IV 2 g (11/22/22 1310)   octreotide (SANDOSTATIN) 500 mcg in sodium chloride 0.9 % 250 mL (2 mcg/mL) infusion 50 mcg/hr (11/22/22 0956)    PRN Inpatient Medications:  ketorolac, LORazepam **OR** LORazepam, ondansetron **OR** ondansetron (ZOFRAN) IV, prochlorperazine, senna-docusate, sorbitol, traMADol  Review of Systems: Constitutional: Weight is stable.  Eyes: No changes in vision. ENT: No oral lesions, sore throat.  GI: see HPI.  Heme/Lymph: No easy bruising.  CV: No chest pain.  GU: No hematuria.  Integumentary: No rashes.  Neuro: No headaches.  Psych: No depression/anxiety.  Endocrine: No heat/cold intolerance.  Allergic/Immunologic: No urticaria.  Resp: No cough, SOB.  Musculoskeletal: No joint swelling.    Physical Examination: BP 126/74 (BP Location: Right Arm)   Pulse 80   Temp 98.6 F (37 C) (Oral)   Resp 18   Ht  (1.753 m)   Wt 107.6 kg   SpO2 96%   BMI 35.03 kg/m  Gen: NAD, alert and oriented x 4 HEENT: PEERLA, EOMI, Neck: supple, no JVD or thyromegaly Chest: CTA bilaterally, no wheezes, crackles, or other adventitious sounds CV: RRR,  no m/g/c/r Abd: soft, NT, ND, +BS in all four quadrants; no HSM, guarding, ridigity, or rebound tenderness Ext: no edema, well perfused with 2+ pulses, Skin: no rash or lesions noted Lymph: no LAD  Data: Lab Results  Component Value Date   WBC 3.3 (L) 11/22/2022   HGB 13.8 11/22/2022   HCT 40.8 11/22/2022   MCV 94.0 11/22/2022   PLT 69 (L) 11/22/2022   Recent Labs  Lab 11/20/22 0936 11/21/22 0542 11/22/22 0643  HGB 15.4 13.5 13.8   Lab Results  Component Value Date   NA 138 11/22/2022   K 3.5 11/22/2022   CL 103 11/22/2022   CO2 27 11/22/2022   BUN 8 11/22/2022   CREATININE 0.69 11/22/2022   Lab Results  Component Value Date   ALT 45 (H) 11/22/2022   AST 46 (H) 11/22/2022   ALKPHOS 77 11/22/2022   BILITOT 1.1 11/22/2022   Recent Labs  Lab 11/20/22 0936  INR 1.3*    Assessment/Plan:  27 y/o Hispanic male with a PMH of HTN, anxiety, depression, polysubstance abuse, and compensated hepatic cirrhosis 2/2 EtOH and hepatic sarcoidosis presented to the [facility] ED via EMS 4/17 for chief complaint of 2-3 day history of RUQ abdominal pain, chest pain, melena, and hematemesis. GI consulted for further evaluation and management.    Hematemesis/Melena - concerning for UGIB. H&H stable currently with no signs of overt gastrointestinal bleeding. No active bleeding. DDx includes esophageal varices, peptic ulcer disease, gastritis, esophagitis, AVMs, GAVE, neoplasm, polyp,  etc. Hemoglobin 13.8 this morning.    Hepatic cirrhosis 2/2 EtOH + hepatic sarcoidosis - MELD-Na 11, Child's A (5)   Chest pain - EKG with sinus tachycardia, suspect potential coronary vasospasm 2/2 cocaine abuse versus anxiety    RUQ abdominal pain    Portal hypertension - mild PHG, HSM, Gr I EV, thrombocytopenia    Polysubstance abuse - EtOH and cocaine    Liver lesion - LR-3, overdue for surveillance   Recommendations:  - Maintain 2 large bore IVs for access - Continue to monitor serial H&H.  Transfuse for Hgb <7.0.  - Continue Ceftriaxone, Octreotide gtt, and Protonix IV  - Serial examinations. Monitor mental status. CIWA protocol as he is high-risk for withdrawal/DTs - Monitor for signs of overt gastrointestinal bleeding - Discussed importance of complete alcohol cessation with patient. If he continues to drink EtOH this will lead to hepatic decompensation and ultimately death.  - Soft diet for now - Plan to recheck UDS on Sunday morning (Day 5) and if negative, plan for EGD with Dr. Allegra Lai. If UDS still positive, will need to recheck UDS on Monday AM and plan for EGD with Dr. Norma Fredrickson on Monday - Dr. Allegra Lai is following for GI over the weekend  I reviewed the risks (including bleeding, perforation, infection, anesthesia complications, cardiac/respiratory complications), benefits and alternatives of EGD. Patient consents to proceed.    Please call with questions or concerns.   Jacob Moores, PA-C [facility] Gastroenterology [phone]

## 2022-11-22 NOTE — Progress Notes (Signed)
Progress Note   Patient: William Beasley WUJ:[id number] DOB: 1995/11/05 DOA: 11/20/2022     1 DOS: the patient was seen and examined on 11/22/2022   Brief hospital course: HPI on admission: "William Beasley is a 27 y.o. male with medical history significant of alcohol dependence, cirrhosis, sarcoidosis, polysubstance abuse, recent admission 3/30--11/06/2022 for gastritis and alcohol withdrawal, presented to the ED for evaluation of nausea/vomiting with hematemesis this morning.  EMS was called.  They reported pt was initially tachycardic in 140's improved to 110's after IV fluids.  Pt seen in the ED on admission this morning.  He reports 10/10 RUQ abdominal pain, ongoing nausea but not vomiting.  He already has developed significant tremors, scored 17 on CIWA scale.  He endorses history of alcohol withdrawal seizure and Icu admission x 7 days for management of severe withdrawal.  He reports drinking typically a case of beer daily, but drank he thinks 30-40 beers yesterday and used 2 g of cocaine last night.  Pt had been treated with Ativan for withdrawal, and therefore not able to provide much detailed history at time of admission encounter.  He reports "I feel like I'm going to die" and "please don't let me die".     ED course -- BP 148/96, HR 99, RR 18, spO2 96% on room air, afebrile. Labs notable for normal hemoglobin 15.4 (up from 13/9 on 11/04/22), K 3.2, AST 61 and ALT 59 both slightly improved from prior.  CBC was normal except mild thrombocytopenia 142k (up from 41k on 11/04/22)."   Admitted to medicine service with gastroenterology consulted.  EGD cannot be performed until UDS negative for cocaine.    Assessment and Plan: * Hematemesis RUQ Abdominal Pain Recent alcohol-induced Gastritis Alcoholic Cirrhosis, Portal Hypertension Hepatic sarcoidosis Suspect variceal vs hemorrhagic gastritis. --Mgmt per Gastroenterology, see their recs --On octreotide drip, IV PPI, ceftriaxone empirically given  cirrhosis and GI bleeding --Monitor closely --soft diet for now, diet per GI --Will need EGD when clinically feasible and negative for cocaine --Daily UDS   Alcohol dependence with withdrawal Pt reports hx of w/d seizure and ICU admission. --CIWA protocol with PRN Ativan. --Continue Librium 25 mg TID and taper as withdrawal symptoms improve. --Will need ongoing couseling and support for alcohol cessation given cirrhosis and its complications. --Appears was started on Naltrexone, will continue this at d/c if patient is agreeable.  Needs follow up to continue it. --Recommend attending AA meetings --TOC providing list of rehab facilities  Anxiety and depression Pt admits his substance abuse is self-medicating for severe anxiety, willing to try medications, therapy to help him get past his self-medicating which is leading to serious health consequences. --Start low dose Lexapro --On Librium for Etoh withdrawal --Hydroxyzine PRN anxiety at d/c --Recommend outpatient psychotherapy  Polysubstance abuse UDS + for Cocaine, Benzo's, cannabinoids, opiates.  Also drinks heavily, approx a case of beer daily.  Self-medicates for unaddressed mental health issues. --TOC consulted to provide resources including list of rehab facilities  RUQ abdominal pain Non-acute CT abdomen/pelvis. Supportive care for gastritis as outlined  Thrombocytopenia Platelets 142k on admission >> 69 k today. Due to liver cirrhosis. Monitor CBC  Alcoholic gastritis POA.   Supportive care with anti-emetics, IV PPI GI is following.  EGD delayed due to +cocaine.  Liver cirrhosis Due to alcohol abuse Monitor LFT's        Subjective: Pt seen with his significant other and young daughter at bedside this AM.  He discusses his struggles with substance abuse,  having severe underlying anxiety and mental health issues for which he feels need to self-medicate.  Feels he cannot feel normal without drinking alcohol, but  recognized the health consequences this has caused.  He is interested in going to rehab after discharge from hospital, and also requests medications to help with his anxiety.   Physical Exam: Vitals:   11/21/22 1609 11/21/22 2020 11/22/22 0423 11/22/22 0914  BP: 122/79 121/87 111/67 126/74  Pulse: 84 89 77 80  Resp: Temp: 98.3 F (36.8 C) 98.3 F (36.8 C) 98.4 F (36.9 C) 98.6 F (37 C)  TempSrc:    Oral  SpO2: 96% 97% 95% 96%  Weight:      Height:       General exam: awake, alert, no acute distress, obese HEENT: moist mucus membranes, hearing grossly normal  Respiratory system: on room air, normal respiratory effort. Cardiovascular system: RRR, no pedal edema.   Gastrointestinal system: soft, NT, ND Central nervous system: no gross focal neurologic deficits, normal speech Extremities: moves all, no edema, normal tone Skin: dry, intact, normal temperature Psychiatry: normal mood, congruent affect, judgement and insight appear normal   Data Reviewed:  Notable labs ---  LFT's improved AST 46, ALT 45, glucose 121, platelets 142 >> 83 >> 69k  Family Communication: Patient's wife/GF and young daughter at bedside on rounds this AM  Disposition: Status is: Inpatient Remains inpatient appropriate because: Severity of illness as outlined, remains on IV therapies and evaluation still ongoing   Planned Discharge Destination: Home    Time spent: 38 minutes  Author: Pennie Banter, DO 11/22/2022 2:37 PM  For on call review [email or web address].

## 2022-11-22 NOTE — Assessment & Plan Note (Signed)
Pt admits his substance abuse is self-medicating for severe anxiety, willing to try medications, therapy to help him get past his self-medicating which is leading to serious health consequences. --Start low dose Lexapro, PRN Vistaril --Recommend outpatient psychotherapy

## 2022-11-22 NOTE — Progress Notes (Signed)
Initial Nutrition Assessment  DOCUMENTATION CODES:   Obesity unspecified  INTERVENTION:   Ensure Max protein supplement BID, each supplement provides 150kcal and 30g of protein.  MVI, folic acid and thiamine po daily   Pt at high refeed risk; recommend monitor potassium, magnesium and phosphorus labs daily until stable  Daily weights   NUTRITION DIAGNOSIS:   Increased nutrient needs related to chronic illness (cirrhosis) as evidenced by estimated needs.  GOAL:   Patient will meet greater than or equal to 90% of their needs  MONITOR:   PO intake, Supplement acceptance, Labs, Weight trends, Skin, I & O's  REASON FOR ASSESSMENT:   Malnutrition Screening Tool    ASSESSMENT:   27 y.o. male with medical history significant of alcohol abuse, thrombocytopenia, cirrhosis, polysubstance abuse, hepatic sarcoidosis and recent admission for etoh intoxication and gastritis who is admitted with hematemesis  and abdominal pain.  Pt is known to this RD from his recent previous admission. Pt with good appetite and oral intake at baseline but reports decreased oral intake for several weeks r/t abdominal pain and vomiting. Pt eating well prior to his last discharge. Pt reports vomiting and abdominal pain that started the day of his admission. Pt eating 100% of meals in hospital. RD will add supplements to help pt meet his estimated needs. Pt is at high refeed risk secondary to etoh abuse. Per chart, pt appears to be down ~13lbs(5%) since his last admission; RD unsure how much weight loss is r/t dehydration. Plan is for EGD once UDS negative.   Medications reviewed and include: folic acid, MVI, protonix, thiamine, NaCl /hr   Labs reviewed: K 3.5 wnl Lipase- 58(H)- 3/30 Wbc- 3.0(L)- 4/1  NUTRITION - FOCUSED PHYSICAL EXAM:  Flowsheet Row Most Recent Value  Orbital Region No depletion  Upper Arm Region No depletion  Thoracic and Lumbar Region No depletion  Buccal Region No depletion   Temple Region No depletion  Clavicle Bone Region No depletion  Clavicle and Acromion Bone Region No depletion  Scapular Bone Region No depletion  Dorsal Hand No depletion  Patellar Region No depletion  Anterior Thigh Region No depletion  Posterior Calf Region No depletion  Edema (RD Assessment) None  Hair Reviewed  Eyes Reviewed  Mouth Reviewed  Skin Reviewed  Nails Reviewed   Diet Order:   Diet Order             Diet NPO time specified  Diet effective ____           DIET SOFT Room service appropriate? Yes; Fluid consistency: Thin  Diet effective now                  EDUCATION NEEDS:   Education needs have been addressed  Skin:  Skin Assessment: Reviewed RN Assessment  Last BM:  4/18  Height:   Ht Readings from Last 1 Encounters:  11/21/22  (1.753 m)    Weight:   Wt Readings from Last 1 Encounters:  11/21/22 107.6 kg    Ideal Body Weight:  72.7 kg  BMI:  Body mass index is 35.03 kg/m.  Estimated Nutritional Needs:   Kcal:  2500-2800kcal/day  Protein:  >125g/day  Fluid:  1.8-2.9L/day  Betsey Holiday MS, RD, LDN Please refer to [facility] for RD and/or RD on-call/weekend/after hours pager

## 2022-11-23 DIAGNOSIS — K7030 Alcoholic cirrhosis of liver without ascites: Secondary | ICD-10-CM | POA: Diagnosis not present

## 2022-11-23 DIAGNOSIS — F10230 Alcohol dependence with withdrawal, uncomplicated: Secondary | ICD-10-CM | POA: Diagnosis not present

## 2022-11-23 DIAGNOSIS — K2921 Alcoholic gastritis with bleeding: Secondary | ICD-10-CM | POA: Diagnosis not present

## 2022-11-23 LAB — CBC
HCT: 40.8 % (ref 39.0–52.0)
Hemoglobin: 14 g/dL (ref 13.0–17.0)
MCH: 32.1 pg (ref 26.0–34.0)
MCHC: 34.3 g/dL (ref 30.0–36.0)
MCV: 93.6 fL (ref 80.0–100.0)
Platelets: 82 10*3/uL — ABNORMAL LOW (ref 150–400)
RBC: 4.36 MIL/uL (ref 4.22–5.81)
RDW: 14.9 % (ref 11.5–15.5)
WBC: 3.3 10*3/uL — ABNORMAL LOW (ref 4.0–10.5)
nRBC: 0 % (ref 0.0–0.2)

## 2022-11-23 LAB — BASIC METABOLIC PANEL
Anion gap: 7 (ref 5–15)
BUN: 8 mg/dL (ref 6–20)
CO2: 25 mmol/L (ref 22–32)
Calcium: 8.4 mg/dL — ABNORMAL LOW (ref 8.9–10.3)
Chloride: 106 mmol/L (ref 98–111)
Creatinine, Ser: 0.59 mg/dL — ABNORMAL LOW (ref 0.61–1.24)
GFR, Estimated: >60 mL/min (ref >60)
Glucose, Bld: 111 mg/dL — ABNORMAL HIGH (ref 70–99)
Potassium: 3.7 mmol/L (ref 3.5–5.1)
Sodium: 138 mmol/L (ref 135–145)

## 2022-11-23 LAB — PHOSPHORUS: Phosphorus: 3.1 mg/dL (ref 2.5–4.6)

## 2022-11-23 LAB — MAGNESIUM: Magnesium: 1.9 mg/dL (ref 1.7–2.4)

## 2022-11-23 MED ORDER — KETOROLAC TROMETHAMINE 30 MG/ML IJ SOLN
30.0000 mg | Freq: Four times a day (QID) | INTRAMUSCULAR | Status: DC | PRN
  Administered 2022-11-23 – 2022-11-26 (×6): 30 mg via INTRAVENOUS
  Filled 2022-11-23 (×6): qty 1

## 2022-11-23 MED ORDER — TRAMADOL HCL 50 MG PO TABS
100.0000 mg | ORAL_TABLET | Freq: Four times a day (QID) | ORAL | Status: DC | PRN
  Administered 2022-11-23 – 2022-11-24 (×3): 100 mg via ORAL
  Filled 2022-11-23 (×3): qty 2

## 2022-11-23 MED ORDER — HYDROXYZINE HCL 25 MG PO TABS
25.0000 mg | ORAL_TABLET | Freq: Three times a day (TID) | ORAL | Status: DC | PRN
  Administered 2022-11-23 – 2022-11-25 (×3): 25 mg via ORAL
  Filled 2022-11-23 (×3): qty 1

## 2022-11-23 MED ORDER — MORPHINE SULFATE (PF) 2 MG/ML IV SOLN
1.0000 mg | INTRAVENOUS | Status: DC | PRN
  Administered 2022-11-23 – 2022-11-24 (×2): 1 mg via INTRAVENOUS
  Filled 2022-11-23 (×2): qty 1

## 2022-11-23 NOTE — Progress Notes (Signed)
Progress Note   Patient: William Beasley WUJ:[id number] DOB: 01/25/1996 DOA: 11/20/2022     2 DOS: the patient was seen and examined on 11/23/2022   Brief hospital course: HPI on admission: "SOTA HETZ is a 27 y.o. male with medical history significant of alcohol dependence, cirrhosis, sarcoidosis, polysubstance abuse, recent admission 3/30--11/06/2022 for gastritis and alcohol withdrawal, presented to the ED for evaluation of nausea/vomiting with hematemesis this morning.  EMS was called.  They reported pt was initially tachycardic in 140's improved to 110's after IV fluids.  Pt seen in the ED on admission this morning.  He reports 10/10 RUQ abdominal pain, ongoing nausea but not vomiting.  He already has developed significant tremors, scored 17 on CIWA scale.  He endorses history of alcohol withdrawal seizure and Icu admission x 7 days for management of severe withdrawal.  He reports drinking typically a case of beer daily, but drank he thinks 30-40 beers yesterday and used 2 g of cocaine last night.  Pt had been treated with Ativan for withdrawal, and therefore not able to provide much detailed history at time of admission encounter.  He reports "I feel like I'm going to die" and "please don't let me die".     ED course -- BP 148/96, HR 99, RR 18, spO2 96% on room air, afebrile. Labs notable for normal hemoglobin 15.4 (up from 13/9 on 11/04/22), K 3.2, AST 61 and ALT 59 both slightly improved from prior.  CBC was normal except mild thrombocytopenia 142k (up from 41k on 11/04/22)."   Admitted to medicine service with gastroenterology consulted.  EGD cannot be performed until UDS negative for cocaine.    Assessment and Plan: * Hematemesis RUQ Abdominal Pain Recent alcohol-induced Gastritis Alcoholic Cirrhosis, Portal Hypertension Hepatic sarcoidosis Suspect variceal vs hemorrhagic gastritis. --Mgmt per Gastroenterology, see their recs --On octreotide drip, IV PPI, ceftriaxone empirically given  cirrhosis and GI bleeding --Monitor closely --soft diet for now, diet per GI --Will need EGD when clinically feasible and negative for cocaine --Daily UDS   Alcohol dependence with withdrawal Pt reports hx of w/d seizure and ICU admission. --CIWA protocol with PRN Ativan. --Continue Librium 25 mg TID and taper as withdrawal symptoms improve. --Will need ongoing couseling and support for alcohol cessation given cirrhosis and its complications. --Appears was started on Naltrexone, will continue this at d/c if patient is agreeable.  Needs follow up to continue it. --Recommend attending AA meetings --TOC providing list of rehab facilities  Anxiety and depression Pt admits his substance abuse is self-medicating for severe anxiety, willing to try medications, therapy to help him get past his self-medicating which is leading to serious health consequences. --Start low dose Lexapro --On Librium for Etoh withdrawal --Hydroxyzine PRN anxiety at d/c --Recommend outpatient psychotherapy  Polysubstance abuse UDS + for Cocaine, Benzo's, cannabinoids, opiates.  Also drinks heavily, approx a case of beer daily.  Self-medicates for unaddressed mental health issues. --TOC consulted to provide resources including list of rehab facilities  RUQ abdominal pain Non-acute CT abdomen/pelvis. Supportive care for gastritis as outlined  Thrombocytopenia Platelets 142k on admission >> 69 k today. Due to liver cirrhosis. Monitor CBC  Alcoholic gastritis POA.   Supportive care with anti-emetics, IV PPI GI is following.  EGD delayed due to +cocaine.  Liver cirrhosis Due to alcohol abuse Monitor LFT's        Subjective: Pt was sleeping with s/o laying in the bed beside him. He woke easily. Reports overall feeling okay, but developing worsening "  liver pain", right-sided abdominal pain, Denies N/V. Tolerating soft diet. Denies signs of bleeding.  States he had endoscopy at [facility] even though positive for  cocaine, so not understanding why it won't be done here until negative.  No other acute complaints.    Physical Exam: Vitals:   11/22/22 2353 11/23/22 0449 11/23/22 0611 11/23/22 0807  BP: 121/69  124/74 119/80  Pulse: 69  64 68  Resp: Temp:   98.4 F (36.9 C) 97.7 F (36.5 C)  TempSrc:   Oral   SpO2: 96%  98% 98%  Weight:  107 kg    Height:       General exam: sleeping, woke to voice, no acute distress, obese HEENT: moist mucus membranes, hearing grossly normal  Respiratory system: on room air, normal respiratory effort. Cardiovascular system: RRR, no pedal edema.   Gastrointestinal system: soft, NT, ND Central nervous system: no gross focal neurologic deficits, normal speech Extremities: moves all, no edema, normal tone Skin: dry, intact, normal temperature Psychiatry: normal mood, congruent affect, judgement and insight appear normal   Data Reviewed:  Notable labs ---   glucose 111, Ca 8.4, WBC 3.3, platelets 82k improved.  Family Communication: Patient's wife/GF at bedside on rounds this AM  Disposition: Status is: Inpatient Remains inpatient appropriate because: Severity of illness as outlined, remains on IV therapies and evaluation still ongoing   Planned Discharge Destination: Home    Time spent: 35 minutes  Author: Pennie Banter, DO 11/23/2022 2:20 PM  For on call review [email or web address].

## 2022-11-24 ENCOUNTER — Encounter
Admission: EM | Disposition: A | Discharge status: Home / Self Care | Payer: Self-pay | Source: Home / Self Care | Attending: Internal Medicine

## 2022-11-24 DIAGNOSIS — D696 Thrombocytopenia, unspecified: Secondary | ICD-10-CM | POA: Diagnosis not present

## 2022-11-24 DIAGNOSIS — K2921 Alcoholic gastritis with bleeding: Secondary | ICD-10-CM | POA: Diagnosis not present

## 2022-11-24 DIAGNOSIS — K7030 Alcoholic cirrhosis of liver without ascites: Secondary | ICD-10-CM | POA: Diagnosis not present

## 2022-11-24 DIAGNOSIS — F10230 Alcohol dependence with withdrawal, uncomplicated: Secondary | ICD-10-CM | POA: Diagnosis not present

## 2022-11-24 LAB — CBC
HCT: 42.2 % (ref 39.0–52.0)
Hemoglobin: 14.3 g/dL (ref 13.0–17.0)
MCH: 31.9 pg (ref 26.0–34.0)
MCHC: 33.9 g/dL (ref 30.0–36.0)
MCV: 94.2 fL (ref 80.0–100.0)
Platelets: 75 10*3/uL — ABNORMAL LOW (ref 150–400)
RBC: 4.48 MIL/uL (ref 4.22–5.81)
RDW: 15.6 % — ABNORMAL HIGH (ref 11.5–15.5)
WBC: 4.7 10*3/uL (ref 4.0–10.5)
nRBC: 0 % (ref 0.0–0.2)

## 2022-11-24 LAB — URINE DRUG SCREEN, QUALITATIVE (ARMC ONLY)
Amphetamines, Ur Screen: NOT DETECTED
Barbiturates, Ur Screen: NOT DETECTED
Benzodiazepine, Ur Scrn: POSITIVE — AB
Cannabinoid 50 Ng, Ur ~~LOC~~: POSITIVE — AB
Cocaine Metabolite,Ur ~~LOC~~: POSITIVE — AB
MDMA (Ecstasy)Ur Screen: NOT DETECTED
Methadone Scn, Ur: NOT DETECTED
Opiate, Ur Screen: POSITIVE — AB
Phencyclidine (PCP) Ur S: NOT DETECTED

## 2022-11-24 SURGERY — ESOPHAGOGASTRODUODENOSCOPY (EGD) WITH PROPOFOL
Anesthesia: General

## 2022-11-24 MED ORDER — SODIUM CHLORIDE 0.9 % IV SOLN: INTRAVENOUS | Status: DC

## 2022-11-24 MED ORDER — OXYCODONE HCL 5 MG PO TABS
5.0000 mg | ORAL_TABLET | ORAL | Status: DC | PRN
  Administered 2022-11-24 – 2022-11-25 (×4): 5 mg via ORAL
  Filled 2022-11-24 (×5): qty 1

## 2022-11-24 MED ORDER — MORPHINE SULFATE (PF) 2 MG/ML IV SOLN
1.0000 mg | INTRAVENOUS | Status: DC | PRN
  Administered 2022-11-24 – 2022-11-25 (×2): 1 mg via INTRAVENOUS
  Filled 2022-11-24 (×2): qty 1

## 2022-11-24 MED ORDER — MORPHINE SULFATE (PF) 2 MG/ML IV SOLN
1.0000 mg | Freq: Once | INTRAVENOUS | Status: AC
  Administered 2022-11-24: 1 mg via INTRAVENOUS
  Filled 2022-11-24: qty 1

## 2022-11-24 MED ORDER — MORPHINE SULFATE (PF) 2 MG/ML IV SOLN
2.0000 mg | Freq: Once | INTRAVENOUS | Status: AC
  Administered 2022-11-25: 2 mg via INTRAVENOUS
  Filled 2022-11-24: qty 1

## 2022-11-24 NOTE — TOC Progression Note (Signed)
Transition of Care [facility]) - Progression Note    Patient Details  Name: William Beasley MRN: [id number] Date of Birth: 03/10/1996  Transition of Care [facility]) CM/SW Contact  Bing Quarry, RN Phone Number: 11/24/2022, 2:51 PM  Clinical Narrative: 4/21: Hard copies of [facility] treatment resources provided to patient per request to review. Gabriel Cirri RN CM            Expected Discharge Plan and Services                                               Social Determinants of Health (SDOH) Interventions SDOH Screenings   Food Insecurity: No Food Insecurity (11/20/2022)  Housing: Low Risk  (11/20/2022)  Transportation Needs: Unmet Transportation Needs (11/20/2022)  Utilities: Not At Risk (11/20/2022)  Tobacco Use: Low Risk  (11/20/2022)    Readmission Risk Interventions     No data to display

## 2022-11-24 NOTE — Plan of Care (Signed)

## 2022-11-24 NOTE — Progress Notes (Signed)
Progress Note   Patient: William Beasley ZOX:[id number] DOB: 11/22/1995 DOA: 11/20/2022     3 DOS: the patient was seen and examined on 11/24/2022   Brief hospital course: HPI on admission: "William Beasley is a 27 y.o. male with medical history significant of alcohol dependence, cirrhosis, sarcoidosis, polysubstance abuse, recent admission 3/30--11/06/2022 for gastritis and alcohol withdrawal, presented to the ED for evaluation of nausea/vomiting with hematemesis this morning.  EMS was called.  They reported pt was initially tachycardic in 140's improved to 110's after IV fluids.  Pt seen in the ED on admission this morning.  He reports 10/10 RUQ abdominal pain, ongoing nausea but not vomiting.  He already has developed significant tremors, scored 17 on CIWA scale.  He endorses history of alcohol withdrawal seizure and Icu admission x 7 days for management of severe withdrawal.  He reports drinking typically a case of beer daily, but drank he thinks 30-40 beers yesterday and used 2 g of cocaine last night.  Pt had been treated with Ativan for withdrawal, and therefore not able to provide much detailed history at time of admission encounter.  He reports "I feel like I'm going to die" and "please don't let me die".     ED course -- BP 148/96, HR 99, RR 18, spO2 96% on room air, afebrile. Labs notable for normal hemoglobin 15.4 (up from 13/9 on 11/04/22), K 3.2, AST 61 and ALT 59 both slightly improved from prior.  CBC was normal except mild thrombocytopenia 142k (up from 41k on 11/04/22)."   Admitted to medicine service with gastroenterology consulted.  EGD cannot be performed until UDS negative for cocaine.     Hospital course prolonged awaiting UDS negative for cocaine for EGD evaluation with GI. 4/21 - UDS still positive cocaine   Assessment and Plan: * Hematemesis RUQ Abdominal Pain Recent alcohol-induced Gastritis Alcoholic Cirrhosis, Portal Hypertension Hepatic sarcoidosis Suspect variceal vs  hemorrhagic gastritis. --Mgmt per Gastroenterology, see their recs --On octreotide drip, IV PPI, ceftriaxone empirically given cirrhosis and GI bleeding --Monitor closely --soft diet for now, diet per GI --Will need EGD when clinically feasible and negative for cocaine --Daily UDS  --Conservative pain mgmt PRN  Alcohol dependence with withdrawal Pt reports hx of w/d seizure and ICU admission. --CIWA protocol with PRN Ativan. --Continue Librium 25 mg TID and taper as withdrawal symptoms improve. --Will need ongoing couseling and support for alcohol cessation given cirrhosis and its complications. --Appears was started on Naltrexone, will continue this at d/c if patient is agreeable.  Needs follow up to continue it. --Recommend attending AA meetings --TOC providing list of rehab facilities  Anxiety and depression Pt admits his substance abuse is self-medicating for severe anxiety, willing to try medications, therapy to help him get past his self-medicating which is leading to serious health consequences. --Start low dose Lexapro --On Librium for Etoh withdrawal --Hydroxyzine PRN anxiety at d/c --Recommend outpatient psychotherapy  Polysubstance abuse UDS + for Cocaine, Benzo's, cannabinoids, opiates.  Also drinks heavily, approx a case of beer daily.  Self-medicates for unaddressed mental health issues. --TOC consulted to provide resources including list of rehab facilities  RUQ abdominal pain Non-acute CT abdomen/pelvis. Supportive care for gastritis as outlined  Thrombocytopenia Platelets 142k on admission >> 69 k today. Due to liver cirrhosis. Monitor CBC  Alcoholic gastritis POA.   Supportive care with anti-emetics, IV PPI GI is following.  EGD delayed due to +cocaine.  Liver cirrhosis Due to alcohol abuse Monitor LFT's  Subjective: Pt was sitting up eating breakfast when seen this AM.  He reports ongoing RUQ pain that has been poorly controlled with  current meds.  No N/V.   Still +cocaine on this AM's UDS, last use reportedly night before this admission.  Advised him of need to use PO pain medication, IV is reserved only for "rescue" breakthrough pain.   Physical Exam: Vitals:   11/23/22 1545 11/23/22 1959 11/24/22 0550 11/24/22 0716  BP: 124/87 136/82 119/67 117/72  Pulse: 67 66 62 61  Resp: Temp: 97.9 F (36.6 C) 97.8 F (36.6 C)  97.9 F (36.6 C)  TempSrc:      SpO2: 90% 97% 97% 97%  Weight:      Height:       General exam: awake alert, no acute distress, obese HEENT: moist mucus membranes, hearing grossly normal  Respiratory system: on room air, normal respiratory effort. Cardiovascular system: RRR, no pedal edema.   Gastrointestinal system: soft, NT, ND Central nervous system: no gross focal neurologic deficits, normal speech Extremities: moves all, no edema, normal tone Skin: dry, intact, normal temperature Psychiatry: normal mood, congruent affect, judgement and insight appear normal   Data Reviewed:  Notable labs ---   glucose 111, Ca 8.4, WBC 3.3, platelets 82k improved. UDS still + cocaine  Family Communication: Patient's wife/GF at bedside on rounds 4/19, 4/20. None present today.  Disposition: Status is: Inpatient Remains inpatient appropriate because: Severity of illness as outlined, remains on IV therapies and evaluation still ongoing   Planned Discharge Destination: Home    Time spent: 35 minutes  Author: Pennie Banter, DO 11/24/2022 3:57 PM  For on call review [email or web address].

## 2022-11-25 DIAGNOSIS — K7030 Alcoholic cirrhosis of liver without ascites: Secondary | ICD-10-CM | POA: Diagnosis not present

## 2022-11-25 DIAGNOSIS — K2921 Alcoholic gastritis with bleeding: Secondary | ICD-10-CM | POA: Diagnosis not present

## 2022-11-25 DIAGNOSIS — F1910 Other psychoactive substance abuse, uncomplicated: Secondary | ICD-10-CM | POA: Diagnosis not present

## 2022-11-25 DIAGNOSIS — F10230 Alcohol dependence with withdrawal, uncomplicated: Secondary | ICD-10-CM | POA: Diagnosis not present

## 2022-11-25 LAB — URINE DRUG SCREEN, QUALITATIVE (ARMC ONLY)
Amphetamines, Ur Screen: NOT DETECTED
Barbiturates, Ur Screen: NOT DETECTED
Benzodiazepine, Ur Scrn: POSITIVE — AB
Cannabinoid 50 Ng, Ur ~~LOC~~: POSITIVE — AB
Cocaine Metabolite,Ur ~~LOC~~: POSITIVE — AB
MDMA (Ecstasy)Ur Screen: NOT DETECTED
Methadone Scn, Ur: NOT DETECTED
Opiate, Ur Screen: POSITIVE — AB
Phencyclidine (PCP) Ur S: NOT DETECTED

## 2022-11-25 LAB — CBC
HCT: 42.5 % (ref 39.0–52.0)
Hemoglobin: 14.3 g/dL (ref 13.0–17.0)
MCH: 31.7 pg (ref 26.0–34.0)
MCHC: 33.6 g/dL (ref 30.0–36.0)
MCV: 94.2 fL (ref 80.0–100.0)
Platelets: 81 10*3/uL — ABNORMAL LOW (ref 150–400)
RBC: 4.51 MIL/uL (ref 4.22–5.81)
RDW: 15.4 % (ref 11.5–15.5)
WBC: 5.7 10*3/uL (ref 4.0–10.5)
nRBC: 0 % (ref 0.0–0.2)

## 2022-11-25 MED ORDER — OXYCODONE HCL 5 MG PO TABS
10.0000 mg | ORAL_TABLET | ORAL | Status: DC | PRN
  Administered 2022-11-25 (×2): 10 mg via ORAL
  Filled 2022-11-25 (×3): qty 2

## 2022-11-25 MED ORDER — OXYCODONE HCL 5 MG PO TABS
10.0000 mg | ORAL_TABLET | Freq: Once | ORAL | Status: AC
  Administered 2022-11-25: 10 mg via ORAL
  Filled 2022-11-25: qty 2

## 2022-11-25 MED ORDER — CHLORDIAZEPOXIDE HCL 5 MG PO CAPS
15.0000 mg | ORAL_CAPSULE | Freq: Three times a day (TID) | ORAL | Status: DC
  Administered 2022-11-25 (×3): 15 mg via ORAL
  Filled 2022-11-25 (×3): qty 1

## 2022-11-25 MED ORDER — MORPHINE SULFATE (PF) 2 MG/ML IV SOLN
2.0000 mg | INTRAVENOUS | Status: DC | PRN
  Administered 2022-11-26 (×2): 2 mg via INTRAVENOUS
  Filled 2022-11-25 (×2): qty 1

## 2022-11-25 NOTE — Progress Notes (Signed)
   GI Inpatient Follow-up Note  UDS still positive this morning. No acute events overnight. Hemoglobin remains stable. Will re-start diet today with orders for NPO after midnight. Repeat UDS 0500 tomorrow morning. If negative, will plan on EGD.   Please call Dr. Norma Fredrickson for any concerns or questions.    Jacob Moores, PA-C [facility] Gastroenter[facility] [phone]

## 2022-11-25 NOTE — Progress Notes (Signed)
Progress Note   Patient: William Beasley ZOX:[id number] DOB: [birth year] DOA: 11/20/2022     4 DOS: the patient was seen and examined on 11/25/2022   Brief hospital course: HPI on admission: "JONATHON TAN is a 27 y.o. male with medical history significant of alcohol dependence, cirrhosis, sarcoidosis, polysubstance abuse, recent admission 3/30--11/06/2022 for gastritis and alcohol withdrawal, presented to the ED for evaluation of nausea/vomiting with hematemesis this morning.  EMS was called.  They reported pt was initially tachycardic in 140's improved to 110's after IV fluids.  Pt seen in the ED on admission this morning.  He reports 10/10 RUQ abdominal pain, ongoing nausea but not vomiting.  He already has developed significant tremors, scored 17 on CIWA scale.  He endorses history of alcohol withdrawal seizure and Icu admission x 7 days for management of severe withdrawal.  He reports drinking typically a case of beer daily, but drank he thinks 30-40 beers yesterday and used 2 g of cocaine last night.  Pt had been treated with Ativan for withdrawal, and therefore not able to provide much detailed history at time of admission encounter.  He reports "I feel like I'm going to die" and "please don't let me die".     ED course -- BP 148/96, HR 99, RR 18, spO2 96% on room air, afebrile. Labs notable for normal hemoglobin 15.4 (up from 13/9 on 11/04/22), K 3.2, AST 61 and ALT 59 both slightly improved from prior.  CBC was normal except mild thrombocytopenia 142k (up from 41k on 11/04/22)."   Admitted to medicine service with gastroenterology consulted.  EGD cannot be performed until UDS negative for cocaine.     Hospital course prolonged awaiting UDS negative for cocaine for EGD evaluation with GI. 4/21 - UDS still positive cocaine   Assessment and Plan: * Hematemesis RUQ Abdominal Pain Recent alcohol-induced Gastritis Alcoholic Cirrhosis, Portal Hypertension Hepatic sarcoidosis Suspect variceal vs  hemorrhagic gastritis. --Mgmt per Gastroenterology, see their recs --On octreotide drip, IV PPI, ceftriaxone empirically given cirrhosis and GI bleeding --Monitor closely --soft diet for now, diet per GI --Will need EGD when clinically feasible and negative for cocaine --Daily UDS  --Conservative pain mgmt PRN  Alcohol dependence with withdrawal Pt reports hx of w/d seizure and ICU admission. --CIWA protocol with PRN Ativan. --Continue Librium 25 mg TID and taper as withdrawal symptoms improve. --Will need ongoing couseling and support for alcohol cessation given cirrhosis and its complications. --Appears was started on Naltrexone, will continue this at d/c if patient is agreeable.  Needs follow up to continue it. --Recommend attending AA meetings --TOC providing list of rehab facilities  Anxiety and depression Pt admits his substance abuse is self-medicating for severe anxiety, willing to try medications, therapy to help him get past his self-medicating which is leading to serious health consequences. --Start low dose Lexapro --On Librium for Etoh withdrawal --Hydroxyzine PRN anxiety at d/c --Recommend outpatient psychotherapy  Polysubstance abuse UDS + for Cocaine, Benzo's, cannabinoids, opiates.  Also drinks heavily, approx a case of beer daily.  Self-medicates for unaddressed mental health issues. --TOC consulted to provide resources including list of rehab facilities  RUQ abdominal pain Non-acute CT abdomen/pelvis. Supportive care for gastritis as outlined  Thrombocytopenia Platelets 142k on admission >> 69 k today. Due to liver cirrhosis. Monitor CBC  Alcoholic gastritis POA.   Supportive care with anti-emetics, IV PPI GI is following.  EGD delayed due to +cocaine.  Liver cirrhosis Due to alcohol abuse Monitor LFT's  Subjective: Pt was seen with GF ambulating around the unit this AM.  He continues to report 10/10 "liver pain" stating none of the  medications are providing relief.  Tolerating diet without N/V but states pain gets worse after meals.   Physical Exam: Vitals:   11/24/22 2000 11/25/22 0352 11/25/22 0500 11/25/22 0834  BP: 108/78 134/79  123/72  Pulse: 67 65  68  Resp: Temp: 97.9 F (36.6 C) 98.4 F (36.9 C)  97.8 F (36.6 C)  TempSrc: Oral Oral    SpO2: 100% 96%  98%  Weight:   117.5 kg   Height:       General exam: awake alert, no acute distress, obese HEENT: moist mucus membranes, hearing grossly normal  Respiratory system: on room air, normal respiratory effort. Cardiovascular system: RRR, no pedal edema.   Gastrointestinal system: soft, NT, ND Central nervous system: no gross focal neurologic deficits, normal speech Extremities: moves all, no edema, normal tone Skin: dry, intact, normal temperature Psychiatry: normal mood, congruent affect, judgement and insight appear normal   Data Reviewed:  UDS still + cocaine No other new labs    Family Communication: Patient's wife/GF at bedside on rounds 4/19, 4/20, 4/22.   Disposition: Status is: Inpatient Remains inpatient appropriate because: Severity of illness as outlined, remains on IV therapies and evaluation still ongoing   Planned Discharge Destination: Home    Time spent: 35 minutes  Author: Pennie Banter, DO 11/25/2022 1:44 PM  For on call review [email or web address].

## 2022-11-26 ENCOUNTER — Inpatient Hospital Stay: Payer: BLUE CROSS/BLUE SHIELD | Admitting: Anesthesiology

## 2022-11-26 ENCOUNTER — Encounter: Payer: Self-pay | Admitting: Internal Medicine

## 2022-11-26 ENCOUNTER — Encounter
Admission: EM | Disposition: A | Discharge status: Home / Self Care | Payer: Self-pay | Source: Home / Self Care | Attending: Internal Medicine

## 2022-11-26 DIAGNOSIS — K7030 Alcoholic cirrhosis of liver without ascites: Secondary | ICD-10-CM | POA: Diagnosis not present

## 2022-11-26 DIAGNOSIS — F10230 Alcohol dependence with withdrawal, uncomplicated: Secondary | ICD-10-CM | POA: Diagnosis not present

## 2022-11-26 DIAGNOSIS — K2921 Alcoholic gastritis with bleeding: Secondary | ICD-10-CM | POA: Diagnosis not present

## 2022-11-26 DIAGNOSIS — D696 Thrombocytopenia, unspecified: Secondary | ICD-10-CM | POA: Diagnosis not present

## 2022-11-26 LAB — COMPREHENSIVE METABOLIC PANEL
ALT: 79 U/L — ABNORMAL HIGH (ref 0–44)
AST: 58 U/L — ABNORMAL HIGH (ref 15–41)
Albumin: 3.5 g/dL (ref 3.5–5.0)
Alkaline Phosphatase: 77 U/L (ref 38–126)
Anion gap: 8 (ref 5–15)
BUN: 10 mg/dL (ref 6–20)
CO2: 27 mmol/L (ref 22–32)
Calcium: 9 mg/dL (ref 8.9–10.3)
Chloride: 101 mmol/L (ref 98–111)
Creatinine, Ser: 0.63 mg/dL (ref 0.61–1.24)
GFR, Estimated: >60 mL/min (ref >60)
Glucose, Bld: 91 mg/dL (ref 70–99)
Potassium: 3.7 mmol/L (ref 3.5–5.1)
Sodium: 136 mmol/L (ref 135–145)
Total Bilirubin: 1.2 mg/dL (ref 0.3–1.2)
Total Protein: 7.4 g/dL (ref 6.5–8.1)

## 2022-11-26 LAB — CBC
HCT: 42.3 % (ref 39.0–52.0)
Hemoglobin: 14.7 g/dL (ref 13.0–17.0)
MCH: 32.7 pg (ref 26.0–34.0)
MCHC: 34.8 g/dL (ref 30.0–36.0)
MCV: 94 fL (ref 80.0–100.0)
Platelets: 78 10*3/uL — ABNORMAL LOW (ref 150–400)
RBC: 4.5 MIL/uL (ref 4.22–5.81)
RDW: 15.4 % (ref 11.5–15.5)
WBC: 5.7 10*3/uL (ref 4.0–10.5)
nRBC: 0 % (ref 0.0–0.2)

## 2022-11-26 LAB — URINE DRUG SCREEN, QUALITATIVE (ARMC ONLY)
Amphetamines, Ur Screen: NOT DETECTED
Barbiturates, Ur Screen: NOT DETECTED
Benzodiazepine, Ur Scrn: POSITIVE — AB
Cannabinoid 50 Ng, Ur ~~LOC~~: POSITIVE — AB
Cocaine Metabolite,Ur ~~LOC~~: POSITIVE — AB
MDMA (Ecstasy)Ur Screen: NOT DETECTED
Methadone Scn, Ur: NOT DETECTED
Opiate, Ur Screen: POSITIVE — AB
Phencyclidine (PCP) Ur S: NOT DETECTED

## 2022-11-26 SURGERY — ESOPHAGOGASTRODUODENOSCOPY (EGD) WITH PROPOFOL
Anesthesia: General

## 2022-11-26 MED ORDER — PANTOPRAZOLE SODIUM 40 MG PO TBEC: 40.0000 mg | DELAYED_RELEASE_TABLET | Freq: Two times a day (BID) | ORAL | 1 refills | Status: AC

## 2022-11-26 MED ORDER — ESCITALOPRAM OXALATE 10 MG PO TABS: 10.0000 mg | ORAL_TABLET | Freq: Every day | ORAL | 2 refills | Status: DC

## 2022-11-26 MED ORDER — HYDROXYZINE HCL 25 MG PO TABS: 25.0000 mg | ORAL_TABLET | Freq: Three times a day (TID) | ORAL | 0 refills | Status: AC | PRN

## 2022-11-26 MED ORDER — ONDANSETRON 8 MG PO TBDP: 8.0000 mg | ORAL_TABLET | Freq: Three times a day (TID) | ORAL | 0 refills | Status: AC | PRN

## 2022-11-26 MED ORDER — CHLORDIAZEPOXIDE HCL 10 MG PO CAPS: 10.0000 mg | ORAL_CAPSULE | Freq: Three times a day (TID) | ORAL | Status: DC

## 2022-11-26 NOTE — Progress Notes (Addendum)
Patient is alert and oriented x4. Complained of R-lower abdominal pain often throughout the night. Received Oxy before midnight. After midnight, he received Toradol and Morphine for pain. Educated patient regarding pain med administration. Informed him that his orders say that we have to try the po meds then Toradol before progressing to morphine. Patient stated that he would speak to the provider regarding this.Has been educated about NPO status. Gave urine cup to obtain sample this AM but patient stated that he had just urinated. Has sample cup in room when ready for collection. Denied additional needs.

## 2022-11-26 NOTE — Discharge Summary (Signed)
Physician Discharge Summary   Patient: William Beasley MRN: [id number] DOB: [birth year]  Admit date:     11/20/2022  Discharge date: 11/26/22  Discharge Physician: Pennie Banter   PCP: [facility]   Recommendations at discharge:   Follow up with [facility] Hepatology Follow up with Primary Care Repeat CBC, CMP at follow up  Discharge Diagnoses: Active Problems:   Alcohol dependence with withdrawal   Liver cirrhosis   Alcoholic gastritis   Thrombocytopenia   RUQ abdominal pain   Polysubstance abuse   Anxiety and depression  Principal Problem (Resolved):   Hematemesis  Hospital Course: HPI on admission: "William Beasley is a 27 y.o. male with medical history significant of alcohol dependence, cirrhosis, sarcoidosis, polysubstance abuse, recent admission 3/30--11/06/2022 for gastritis and alcohol withdrawal, presented to the ED for evaluation of nausea/vomiting with hematemesis this morning.  EMS was called.  They reported pt was initially tachycardic in 140's improved to 110's after IV fluids.  Pt seen in the ED on admission this morning.  He reports 10/10 RUQ abdominal pain, ongoing nausea but not vomiting.  He already has developed significant tremors, scored 17 on CIWA scale.  He endorses history of alcohol withdrawal seizure and Icu admission x 7 days for management of severe withdrawal.  He reports drinking typically a case of beer daily, but drank he thinks 30-40 beers yesterday and used 2 g of cocaine last night.  Pt had been treated with Ativan for withdrawal, and therefore not able to provide much detailed history at time of admission encounter.  He reports "I feel like I'm going to die" and "please don't let me die".     ED course -- BP 148/96, HR 99, RR 18, spO2 96% on room air, afebrile. Labs notable for normal hemoglobin 15.4 (up from 13/9 on 11/04/22), K 3.2, AST 61 and ALT 59 both slightly improved from prior.  CBC was normal except mild thrombocytopenia 142k (up  from 41k on 11/04/22)."   Admitted to medicine service with gastroenterology consulted.  EGD cannot be performed until UDS negative for cocaine.  [facility] course was prolonged due to UDS persistently positive for cocaine, raising concern for use in the hospital which patient denies.    4/23 - UDS again positive today. GI has recommended discharge and follow up at [facility], given no signs of recurrent bleeding, no anemia.  EGD can be done as outpatient.  Patient is medically stable for d/c.   Assessment and Plan: * Hematemesis-resolved as of 11/26/2022 RUQ Abdominal Pain Recent alcohol-induced Gastritis Alcoholic Cirrhosis, Portal Hypertension Hepatic sarcoidosis Suspect variceal vs hemorrhagic gastritis. --Mgmt per Gastroenterology, see their recs --Treated with octreotide drip, IV PPI, ceftriaxone empirically given cirrhosis and GI bleeding --soft diet  --EGD could not be performed due to persistent UDS + for cocaine since admission --GI recommends follow up at [facility] Hepatology  Alcohol dependence with withdrawal Pt reports hx of w/d seizure and ICU admission. --CIWA protocol with PRN Ativan. --Treated with Librium, tapered off --Will need ongoing couseling and support for alcohol cessation given cirrhosis and its complications. --Recommend attending AA meetings --TOC providing list of rehab facilities  Anxiety and depression Pt admits his substance abuse is self-medicating for severe anxiety, willing to try medications, therapy to help him get past his self-medicating which is leading to serious health consequences. --Start low dose Lexapro, PRN Vistaril --Recommend outpatient psychotherapy  Polysubstance abuse UDS + for Cocaine, Benzo's, cannabinoids, opiates.  Also drinks heavily, approx  a case of beer daily.  Self-medicates for unaddressed mental health issues. --TOC consulted to provide resources including list of rehab facilities  RUQ abdominal pain Non-acute CT  abdomen/pelvis. Supportive care for gastritis as outlined  Thrombocytopenia Platelets 142k on admission >> 69 k today. Due to liver cirrhosis. Monitor CBC  Alcoholic gastritis POA.   Supportive care with anti-emetics, IV PPI GI is following.  EGD delayed due to +cocaine.  Liver cirrhosis Due to alcohol abuse Monitor LFT's         Consultants: GI Procedures performed: None  Disposition: Home Diet recommendation:  Discharge Diet Orders (From admission, onward)     Start     Ordered   11/26/22 0000  Diet - low sodium heart healthy        11/26/22 1058            DISCHARGE MEDICATION: Allergies as of 11/26/2022   No Known Allergies      Medication List     TAKE these medications    carvedilol 6.25 MG tablet Commonly known as: COREG Take 1 tablet (6.25 mg total) by mouth 2 (two) times daily.   Cholecalciferol 25 MCG (1000 UT) capsule Take 1,000 Units by mouth daily.   escitalopram 10 MG tablet Commonly known as: LEXAPRO Take 1 tablet (10 mg total) by mouth daily. What changed:  medication strength how much to take   folic acid 1 MG tablet Commonly known as: FOLVITE Take 1 tablet (1 mg total) by mouth daily.   hydrOXYzine 25 MG tablet Commonly known as: ATARAX Take 1 tablet (25 mg total) by mouth 3 (three) times daily as needed for anxiety.   multivitamin with minerals Tabs tablet Take 1 tablet by mouth daily.   ondansetron 8 MG disintegrating tablet Commonly known as: ZOFRAN-ODT Take 1 tablet (8 mg total) by mouth every 8 (eight) hours as needed for nausea or vomiting.   pantoprazole 40 MG tablet Commonly known as: Protonix Take 1 tablet (40 mg total) by mouth 2 (two) times daily.   thiamine 100 MG tablet Commonly known as: VITAMIN B1 Take 1 tablet (100 mg total) by mouth daily.   traZODone 50 MG tablet Commonly known as: DESYREL Take 50 mg by mouth at bedtime.        Discharge Exam: Filed Weights   11/23/22 0449 11/25/22 0500  11/26/22 0500  Weight: 107 kg 117.5 kg 116.4 kg   General exam: awake, alert, no acute distress, obese HEENT: atraumatic, clear conjunctiva, anicteric sclera, moist mucus membranes, hearing grossly normal  Respiratory system: on room air, normal respiratory effort. Cardiovascular system: RRR, no pedal edema.   Gastrointestinal system: soft, NT, ND, no HSM felt, +bowel sounds. Central nervous system: A&O x 4. no gross focal neurologic deficits, normal speech Extremities: moves all , no edema, normal tone Skin: dry, intact, normal temperature Psychiatry: normal mood, congruent affect, judgement and insight appear normal '  Condition at discharge: stable  The results of significant diagnostics from this hospitalization (including imaging, microbiology, ancillary and laboratory) are listed below for reference.   Imaging Studies: CT ABDOMEN PELVIS W CONTRAST  Result Date: 11/02/2022 CLINICAL DATA:  Epigastric pain. EXAM: CT ABDOMEN AND PELVIS WITH CONTRAST TECHNIQUE: Multidetector CT imaging of the abdomen and pelvis was performed using the standard protocol following bolus administration of intravenous contrast. RADIATION DOSE REDUCTION: This exam was performed according to the departmental dose-optimization program which includes automated exposure control, adjustment of the mA and/or kV according to patient size and/or use  of iterative reconstruction technique. CONTRAST:  OMNIPAQUE IOHEXOL 350 MG/ML SOLN COMPARISON:  CT of the chest abdomen pelvis dated 03/12/2022. FINDINGS: Lower chest: Minimal bibasilar atelectasis. The visualized lung bases are otherwise clear. Partially visualized hilar and mediastinal adenopathy, seen on the prior CT. Clinical correlation is recommended. No intra-abdominal free air or free fluid. Hepatobiliary: Fatty liver with morphologic changes of cirrhosis. No biliary dilatation. The gallbladder is unremarkable. Pancreas: Unremarkable. No pancreatic ductal  dilatation or surrounding inflammatory changes. Spleen: Mild splenomegaly measuring up to 17 cm in AP length, similar to prior CT. Adrenals/Urinary Tract: The adrenal glands are unremarkable. The kidneys, visualized ureters, and urinary bladder appear unremarkable. Stomach/Bowel: There is no bowel obstruction or active inflammation. There is a small diverticula of the ascending colon without inflammatory changes. The appendix is normal. Vascular/Lymphatic: The abdominal aorta and IVC are unremarkable. Paraesophageal varices as well as anterior abdominal collaterals noted. No portal venous gas. There is no adenopathy. Reproductive: The prostate and seminal vesicles are grossly unremarkable. No pelvic mass. Other: None Musculoskeletal: No acute or significant osseous findings. IMPRESSION: 1. No acute intra-abdominal or pelvic pathology. 2. Cirrhosis with portal hypertension, similar to prior CT. 3. Partially visualized hilar and mediastinal adenopathy, seen on the prior CT. Electronically Signed   By: Elgie Collard M.D.   On: 11/02/2022 23:43    Microbiology: Results for orders placed or performed during the hospital encounter of 03/12/22  MRSA Next Gen by PCR, Nasal     Status: None   Collection Time: 03/12/22  8:56 PM   Specimen: Nasal Mucosa; Nasal Swab  Result Value Ref Range Status   MRSA by PCR Next Gen NOT DETECTED NOT DETECTED Final    Comment: (NOTE) The GeneXpert MRSA Assay (FDA approved for NASAL specimens only), is one component of a comprehensive MRSA colonization surveillance program. It is not intended to diagnose MRSA infection nor to guide or monitor treatment for MRSA infections. Test performance is not FDA approved in patients less than 100 years old. Performed at [facility], [address] [place], [place] [address]   Culture, blood (Routine X 2) w Reflex to ID Panel     Status: None   Collection Time: 03/13/22  2:08 AM   Specimen: BLOOD  Result Value Ref Range  Status   Specimen Description BLOOD LEFT HAND  Final   Special Requests   Final    BOTTLES DRAWN AEROBIC AND ANAEROBIC Blood Culture adequate volume   Culture   Final    NO GROWTH 5 DAYS Performed at [facility], [address] [place], [place] [address]    Report Status 03/18/2022 FINAL  Final  Culture, blood (Routine X 2) w Reflex to ID Panel     Status: None   Collection Time: 03/13/22  2:08 AM   Specimen: BLOOD  Result Value Ref Range Status   Specimen Description BLOOD LEFT FOREARM  Final   Special Requests   Final    IN PEDIATRIC BOTTLE Blood Culture results may not be optimal due to an excessive volume of blood received in culture bottles   Culture   Final    NO GROWTH 5 DAYS Performed at [facility], [address] [place], [place] [address]    Report Status 03/18/2022 FINAL  Final    Labs: CBC: Recent Labs  Lab 11/22/22 0643 11/23/22 0505 11/24/22 0530 11/25/22 0524 11/26/22 0553  WBC 3.3* 3.3* 4.7 5.7 5.7  HGB 13.8 14.0 14.3 14.3 14.7  HCT  40.8 40.8 42.2 42.5 42.3  MCV 94.0 93.6 94.2 94.2 94.0  PLT 69* 82* 75* 81* 78*   Basic Metabolic Panel: Recent Labs  Lab 11/20/22 0936 11/21/22 0542 11/22/22 0643 11/23/22 0505 11/26/22 0553  NA 139 136 138 138 136  K 3.2* 3.9 3.5 3.7 3.7  CL 105 101 103 106 101  CO2 GLUCOSE 114* 112* 121* 111* 91  BUN CREATININE 0.61 0.63 0.69 0.59* 0.63  CALCIUM 8.6* 9.0 8.7* 8.4* 9.0  MG 2.0 2.0  --  1.9  --   PHOS  --  3.0  --  3.1  --    Liver Function Tests: Recent Labs  Lab 11/20/22 0936 11/21/22 0542 11/22/22 0643 11/26/22 0553  AST 61* 49* 46* 58*  ALT 59* 47* 45* 79*  ALKPHOS 90 80 77 77  BILITOT 1.2 2.0* 1.1 1.2  PROT 8.4* 7.4 7.3 7.4  ALBUMIN 3.7 3.5 3.4* 3.5   CBG: No results for input(s): "GLUCAP" in the last 168 hours.  Discharge time spent: less than 30 minutes.  Signed: Pennie Banter, DO [facility] 11/26/2022

## 2022-11-26 NOTE — Progress Notes (Signed)
Delaney Meigs to be D/C'd Home per MD order.  Discussed prescriptions and follow up appointments with the patient. Prescriptions given to patient, medication list explained in detail. Pt verbalized understanding.  Allergies as of 11/26/2022   No Known Allergies      Medication List     TAKE these medications    carvedilol 6.25 MG tablet Commonly known as: COREG Take 1 tablet (6.25 mg total) by mouth 2 (two) times daily.   Cholecalciferol 25 MCG (1000 UT) capsule Take 1,000 Units by mouth daily.   escitalopram 10 MG tablet Commonly known as: LEXAPRO Take 1 tablet (10 mg total) by mouth daily. What changed:  medication strength how much to take   folic acid 1 MG tablet Commonly known as: FOLVITE Take 1 tablet (1 mg total) by mouth daily.   hydrOXYzine 25 MG tablet Commonly known as: ATARAX Take 1 tablet (25 mg total) by mouth 3 (three) times daily as needed for anxiety.   multivitamin with minerals Tabs tablet Take 1 tablet by mouth daily.   ondansetron 8 MG disintegrating tablet Commonly known as: ZOFRAN-ODT Take 1 tablet (8 mg total) by mouth every 8 (eight) hours as needed for nausea or vomiting.   pantoprazole 40 MG tablet Commonly known as: Protonix Take 1 tablet (40 mg total) by mouth 2 (two) times daily.   thiamine 100 MG tablet Commonly known as: VITAMIN B1 Take 1 tablet (100 mg total) by mouth daily.   traZODone 50 MG tablet Commonly known as: DESYREL Take 50 mg by mouth at bedtime.        Vitals:   11/25/22 1915 11/26/22 0438  BP: (!) 141/91 129/85  Pulse: 75 77  Resp: 20 16  Temp: 97.9 F (36.6 C) 98 F (36.7 C)  SpO2: 97% 98%    Skin clean, dry and intact without evidence of skin break down, no evidence of skin tears noted. IV catheter discontinued intact. Site without signs and symptoms of complications. Dressing and pressure applied. Pt denies pain at this time. No complaints noted.  An After Visit Summary was printed and given to the  patient. Patient escorted via WC, and D/C home via private auto.  Genowefa Morga C. Jilda Roche

## 2022-11-26 NOTE — Anesthesia Preprocedure Evaluation (Signed)
Anesthesia Evaluation  Patient identified by MRN, date of birth, ID band Patient awake    Reviewed: Allergy & Precautions, H&P , NPO status , Patient's Chart, lab work & pertinent test results  Airway Mallampati: II  TM Distance: >3 FB Neck ROM: full    Dental no notable dental hx.    Pulmonary neg pulmonary ROS   Pulmonary exam normal        Cardiovascular negative cardio ROS Normal cardiovascular exam     Neuro/Psych negative neurological ROS  negative psych ROS   GI/Hepatic negative GI ROS, Neg liver ROS,,,  Endo/Other  negative endocrine ROS    Renal/GU      Musculoskeletal   Abdominal   Peds  Hematology negative hematology ROS (+)   Anesthesia Other Findings Past Medical History: No date: Cirrhosis of liver  History reviewed. No pertinent surgical history.  BMI    Body Mass Index: 37.91 kg/m      Reproductive/Obstetrics negative OB ROS                              Anesthesia Physical Anesthesia Plan  ASA:   Anesthesia Plan: General ETT   Post-op Pain Management:    Induction:   PONV Risk Score and Plan: 2 and Ondansetron, Dexamethasone and Midazolam  Airway Management Planned:   Additional Equipment:   Intra-op Plan:   Post-operative Plan:   Informed Consent:      Dental Advisory Given  Plan Discussed with: CRNA and Surgeon  Anesthesia Plan Comments:          Anesthesia Quick Evaluation

## 2022-12-11 ENCOUNTER — Encounter
Admission: EM | Disposition: A | Discharge status: Home / Self Care | Payer: Self-pay | Source: Home / Self Care | Attending: Family Medicine

## 2022-12-11 ENCOUNTER — Inpatient Hospital Stay: Payer: BLUE CROSS/BLUE SHIELD | Admitting: Anesthesiology

## 2022-12-11 ENCOUNTER — Inpatient Hospital Stay
Admission: EM | Admit: 2022-12-11 | Discharge: 2022-12-14 | DRG: 432 | Disposition: A | Discharge status: Home / Self Care | Payer: BLUE CROSS/BLUE SHIELD | Attending: Family Medicine | Admitting: Family Medicine

## 2022-12-11 ENCOUNTER — Emergency Department: Payer: BLUE CROSS/BLUE SHIELD

## 2022-12-11 ENCOUNTER — Other Ambulatory Visit: Payer: Self-pay

## 2022-12-11 DIAGNOSIS — D8689 Sarcoidosis of other sites: Secondary | ICD-10-CM | POA: Diagnosis present

## 2022-12-11 DIAGNOSIS — K7030 Alcoholic cirrhosis of liver without ascites: Principal | ICD-10-CM | POA: Diagnosis present

## 2022-12-11 DIAGNOSIS — K921 Melena: Principal | ICD-10-CM | POA: Diagnosis present

## 2022-12-11 DIAGNOSIS — Z79899 Other long term (current) drug therapy: Secondary | ICD-10-CM

## 2022-12-11 DIAGNOSIS — K3189 Other diseases of stomach and duodenum: Secondary | ICD-10-CM | POA: Diagnosis present

## 2022-12-11 DIAGNOSIS — I10 Essential (primary) hypertension: Secondary | ICD-10-CM | POA: Diagnosis present

## 2022-12-11 DIAGNOSIS — F1910 Other psychoactive substance abuse, uncomplicated: Secondary | ICD-10-CM | POA: Diagnosis not present

## 2022-12-11 DIAGNOSIS — F10239 Alcohol dependence with withdrawal, unspecified: Secondary | ICD-10-CM | POA: Diagnosis present

## 2022-12-11 DIAGNOSIS — F32A Depression, unspecified: Secondary | ICD-10-CM | POA: Diagnosis present

## 2022-12-11 DIAGNOSIS — F1410 Cocaine abuse, uncomplicated: Secondary | ICD-10-CM | POA: Diagnosis present

## 2022-12-11 DIAGNOSIS — I8510 Secondary esophageal varices without bleeding: Secondary | ICD-10-CM | POA: Diagnosis present

## 2022-12-11 DIAGNOSIS — D696 Thrombocytopenia, unspecified: Secondary | ICD-10-CM | POA: Diagnosis present

## 2022-12-11 DIAGNOSIS — K766 Portal hypertension: Secondary | ICD-10-CM | POA: Diagnosis present

## 2022-12-11 DIAGNOSIS — Y905 Blood alcohol level of 100-119 mg/100 ml: Secondary | ICD-10-CM | POA: Diagnosis present

## 2022-12-11 DIAGNOSIS — F419 Anxiety disorder, unspecified: Secondary | ICD-10-CM | POA: Diagnosis present

## 2022-12-11 DIAGNOSIS — Z66 Do not resuscitate: Secondary | ICD-10-CM | POA: Diagnosis present

## 2022-12-11 DIAGNOSIS — K920 Hematemesis: Secondary | ICD-10-CM | POA: Diagnosis present

## 2022-12-11 DIAGNOSIS — R000 Tachycardia, unspecified: Secondary | ICD-10-CM | POA: Diagnosis present

## 2022-12-11 DIAGNOSIS — K922 Gastrointestinal hemorrhage, unspecified: Secondary | ICD-10-CM | POA: Diagnosis present

## 2022-12-11 DIAGNOSIS — I8511 Secondary esophageal varices with bleeding: Secondary | ICD-10-CM | POA: Diagnosis present

## 2022-12-11 DIAGNOSIS — K7460 Unspecified cirrhosis of liver: Secondary | ICD-10-CM | POA: Diagnosis present

## 2022-12-11 HISTORY — PX: ESOPHAGOGASTRODUODENOSCOPY (EGD) WITH PROPOFOL: SHX5813

## 2022-12-11 LAB — COMPREHENSIVE METABOLIC PANEL
ALT: 41 U/L (ref 0–44)
AST: 48 U/L — ABNORMAL HIGH (ref 15–41)
Albumin: 4 g/dL (ref 3.5–5.0)
Alkaline Phosphatase: 92 U/L (ref 38–126)
Anion gap: 12 (ref 5–15)
BUN: 15 mg/dL (ref 6–20)
CO2: 22 mmol/L (ref 22–32)
Calcium: 8.9 mg/dL (ref 8.9–10.3)
Chloride: 105 mmol/L (ref 98–111)
Creatinine, Ser: 0.53 mg/dL — ABNORMAL LOW (ref 0.61–1.24)
GFR, Estimated: >60 mL/min (ref >60)
Glucose, Bld: 105 mg/dL — ABNORMAL HIGH (ref 70–99)
Potassium: 3.6 mmol/L (ref 3.5–5.1)
Sodium: 139 mmol/L (ref 135–145)
Total Bilirubin: 1.4 mg/dL — ABNORMAL HIGH (ref 0.3–1.2)
Total Protein: 8.4 g/dL — ABNORMAL HIGH (ref 6.5–8.1)

## 2022-12-11 LAB — CBC
HCT: 45.1 % (ref 39.0–52.0)
Hemoglobin: 15.2 g/dL (ref 13.0–17.0)
MCH: 31.5 pg (ref 26.0–34.0)
MCHC: 33.7 g/dL (ref 30.0–36.0)
MCV: 93.4 fL (ref 80.0–100.0)
Platelets: 162 10*3/uL (ref 150–400)
RBC: 4.83 MIL/uL (ref 4.22–5.81)
RDW: 15.9 % — ABNORMAL HIGH (ref 11.5–15.5)
WBC: 8 10*3/uL (ref 4.0–10.5)
nRBC: 0 % (ref 0.0–0.2)

## 2022-12-11 LAB — PROTIME-INR
INR: 1.3 — ABNORMAL HIGH (ref 0.8–1.2)
Prothrombin Time: 16.3 seconds — ABNORMAL HIGH (ref 11.4–15.2)

## 2022-12-11 LAB — TROPONIN I (HIGH SENSITIVITY): Troponin I (High Sensitivity): 6 ng/L (ref <18)

## 2022-12-11 LAB — TYPE AND SCREEN
ABO/RH(D): A POS
Antibody Screen: NEGATIVE

## 2022-12-11 LAB — ETHANOL: Alcohol, Ethyl (B): 100 mg/dL — ABNORMAL HIGH (ref <10)

## 2022-12-11 LAB — APTT: aPTT: 34 seconds (ref 24–36)

## 2022-12-11 SURGERY — ESOPHAGOGASTRODUODENOSCOPY (EGD) WITH PROPOFOL
Anesthesia: General

## 2022-12-11 MED ORDER — ONDANSETRON HCL 4 MG/2ML IJ SOLN
4.0000 mg | Freq: Once | INTRAMUSCULAR | Status: AC
  Administered 2022-12-11: 4 mg via INTRAVENOUS
  Filled 2022-12-11: qty 2

## 2022-12-11 MED ORDER — SODIUM CHLORIDE 0.9 % IV SOLN: INTRAVENOUS | Status: DC

## 2022-12-11 MED ORDER — ONDANSETRON HCL 4 MG/2ML IJ SOLN
INTRAMUSCULAR | Status: AC
  Filled 2022-12-11: qty 2

## 2022-12-11 MED ORDER — THIAMINE MONONITRATE 100 MG PO TABS
100.0000 mg | ORAL_TABLET | Freq: Every day | ORAL | Status: DC
  Administered 2022-12-12 – 2022-12-14 (×2): 100 mg via ORAL
  Filled 2022-12-11 (×2): qty 1

## 2022-12-11 MED ORDER — SUGAMMADEX SODIUM 200 MG/2ML IV SOLN
INTRAVENOUS | Status: DC | PRN
  Administered 2022-12-11: 250 mg via INTRAVENOUS

## 2022-12-11 MED ORDER — MIDAZOLAM HCL 2 MG/2ML IJ SOLN
INTRAMUSCULAR | Status: AC
  Filled 2022-12-11: qty 2

## 2022-12-11 MED ORDER — SODIUM CHLORIDE 0.9 % IV SOLN: Freq: Once | INTRAVENOUS | Status: AC

## 2022-12-11 MED ORDER — ONDANSETRON HCL 4 MG/2ML IJ SOLN
INTRAMUSCULAR | Status: DC | PRN
  Administered 2022-12-11: 4 mg via INTRAVENOUS

## 2022-12-11 MED ORDER — SODIUM CHLORIDE 0.9 % IV SOLN
50.0000 ug/h | INTRAVENOUS | Status: DC
  Administered 2022-12-11 – 2022-12-14 (×7): 50 ug/h via INTRAVENOUS
  Filled 2022-12-11 (×10): qty 1

## 2022-12-11 MED ORDER — FENTANYL CITRATE (PF) 100 MCG/2ML IJ SOLN
INTRAMUSCULAR | Status: DC | PRN
  Administered 2022-12-11: 50 ug via INTRAVENOUS

## 2022-12-11 MED ORDER — ROCURONIUM BROMIDE 10 MG/ML (PF) SYRINGE
PREFILLED_SYRINGE | INTRAVENOUS | Status: AC
  Filled 2022-12-11: qty 10

## 2022-12-11 MED ORDER — PROPOFOL 10 MG/ML IV BOLUS
INTRAVENOUS | Status: AC
  Filled 2022-12-11: qty 20

## 2022-12-11 MED ORDER — SUCCINYLCHOLINE CHLORIDE 200 MG/10ML IV SOSY
PREFILLED_SYRINGE | INTRAVENOUS | Status: AC
  Filled 2022-12-11: qty 10

## 2022-12-11 MED ORDER — LORAZEPAM 2 MG/ML IJ SOLN
1.0000 mg | Freq: Once | INTRAMUSCULAR | Status: AC
  Administered 2022-12-11: 1 mg via INTRAVENOUS
  Filled 2022-12-11: qty 1

## 2022-12-11 MED ORDER — ADULT MULTIVITAMIN W/MINERALS CH
1.0000 | ORAL_TABLET | Freq: Every day | ORAL | Status: DC
  Administered 2022-12-11 – 2022-12-14 (×3): 1 via ORAL
  Filled 2022-12-11 (×3): qty 1

## 2022-12-11 MED ORDER — FENTANYL CITRATE (PF) 100 MCG/2ML IJ SOLN
INTRAMUSCULAR | Status: AC
  Filled 2022-12-11: qty 2

## 2022-12-11 MED ORDER — LORAZEPAM 1 MG PO TABS
1.0000 mg | ORAL_TABLET | ORAL | Status: DC | PRN
  Administered 2022-12-12 (×2): 1 mg via ORAL
  Administered 2022-12-12 (×3): 2 mg via ORAL
  Administered 2022-12-13: 1 mg via ORAL
  Administered 2022-12-13: 2 mg via ORAL
  Administered 2022-12-14: 1 mg via ORAL
  Administered 2022-12-14: 2 mg via ORAL
  Filled 2022-12-11 (×3): qty 1
  Filled 2022-12-11: qty 2
  Filled 2022-12-11: qty 4
  Filled 2022-12-11: qty 1
  Filled 2022-12-11 (×3): qty 2

## 2022-12-11 MED ORDER — LIDOCAINE HCL (PF) 2 % IJ SOLN
INTRAMUSCULAR | Status: AC
  Filled 2022-12-11: qty 5

## 2022-12-11 MED ORDER — LIDOCAINE HCL (CARDIAC) PF 100 MG/5ML IV SOSY
PREFILLED_SYRINGE | INTRAVENOUS | Status: DC | PRN
  Administered 2022-12-11: 100 mg via INTRAVENOUS

## 2022-12-11 MED ORDER — DEXAMETHASONE SODIUM PHOSPHATE 10 MG/ML IJ SOLN
INTRAMUSCULAR | Status: DC | PRN
  Administered 2022-12-11: 10 mg via INTRAVENOUS

## 2022-12-11 MED ORDER — LORAZEPAM 2 MG/ML IJ SOLN
1.0000 mg | INTRAMUSCULAR | Status: DC | PRN
  Administered 2022-12-13 (×2): 2 mg via INTRAVENOUS
  Filled 2022-12-11 (×3): qty 1

## 2022-12-11 MED ORDER — MIDAZOLAM HCL 2 MG/2ML IJ SOLN
INTRAMUSCULAR | Status: DC | PRN
  Administered 2022-12-11: 2 mg via INTRAVENOUS

## 2022-12-11 MED ORDER — SODIUM CHLORIDE 0.9% FLUSH
3.0000 mL | Freq: Two times a day (BID) | INTRAVENOUS | Status: DC
  Administered 2022-12-11 – 2022-12-14 (×6): 3 mL via INTRAVENOUS

## 2022-12-11 MED ORDER — ONDANSETRON HCL 4 MG/2ML IJ SOLN
4.0000 mg | Freq: Four times a day (QID) | INTRAMUSCULAR | Status: DC | PRN
  Administered 2022-12-11 – 2022-12-14 (×2): 4 mg via INTRAVENOUS
  Filled 2022-12-11 (×2): qty 2

## 2022-12-11 MED ORDER — LORAZEPAM 2 MG/ML IJ SOLN: 0.5000 mg | Freq: Once | INTRAMUSCULAR | Status: DC

## 2022-12-11 MED ORDER — PANTOPRAZOLE SODIUM 40 MG IV SOLR
40.0000 mg | Freq: Two times a day (BID) | INTRAVENOUS | Status: DC
  Administered 2022-12-11 – 2022-12-14 (×6): 40 mg via INTRAVENOUS
  Filled 2022-12-11 (×6): qty 10

## 2022-12-11 MED ORDER — HYDROMORPHONE HCL 1 MG/ML IJ SOLN
0.2500 mg | INTRAMUSCULAR | Status: DC | PRN
  Administered 2022-12-11 – 2022-12-13 (×9): 0.25 mg via INTRAVENOUS
  Filled 2022-12-11 (×5): qty 0.5
  Filled 2022-12-11: qty 1
  Filled 2022-12-11: qty 0.5
  Filled 2022-12-11: qty 1
  Filled 2022-12-11: qty 0.5

## 2022-12-11 MED ORDER — SODIUM CHLORIDE 0.9 % IV SOLN
1.0000 g | INTRAVENOUS | Status: DC
  Administered 2022-12-11 – 2022-12-13 (×3): 1 g via INTRAVENOUS
  Filled 2022-12-11: qty 1
  Filled 2022-12-11 (×3): qty 10

## 2022-12-11 MED ORDER — THIAMINE HCL 100 MG/ML IJ SOLN
100.0000 mg | Freq: Every day | INTRAMUSCULAR | Status: DC
  Administered 2022-12-11 – 2022-12-13 (×2): 100 mg via INTRAVENOUS
  Filled 2022-12-11 (×2): qty 2

## 2022-12-11 MED ORDER — FOLIC ACID 1 MG PO TABS
1.0000 mg | ORAL_TABLET | Freq: Every day | ORAL | Status: DC
  Administered 2022-12-11 – 2022-12-14 (×3): 1 mg via ORAL
  Filled 2022-12-11 (×3): qty 1

## 2022-12-11 MED ORDER — PANTOPRAZOLE SODIUM 40 MG IV SOLR
40.0000 mg | Freq: Once | INTRAVENOUS | Status: AC
  Administered 2022-12-11: 40 mg via INTRAVENOUS
  Filled 2022-12-11: qty 10

## 2022-12-11 MED ORDER — DEXAMETHASONE SODIUM PHOSPHATE 10 MG/ML IJ SOLN
INTRAMUSCULAR | Status: AC
  Filled 2022-12-11: qty 1

## 2022-12-11 MED ORDER — ROCURONIUM BROMIDE 100 MG/10ML IV SOLN
INTRAVENOUS | Status: DC | PRN
  Administered 2022-12-11: 60 mg via INTRAVENOUS

## 2022-12-11 MED ORDER — PROPOFOL 10 MG/ML IV BOLUS
INTRAVENOUS | Status: DC | PRN
  Administered 2022-12-11: 250 mg via INTRAVENOUS

## 2022-12-11 MED ORDER — OCTREOTIDE LOAD VIA INFUSION
50.0000 ug | Freq: Once | INTRAVENOUS | Status: AC
  Administered 2022-12-11: 50 ug via INTRAVENOUS
  Filled 2022-12-11: qty 25

## 2022-12-11 NOTE — Assessment & Plan Note (Signed)
History of alcohol use disorder that is severe and active.  Ethanol level pending, however patient states his last drink was at 6 AM.  - CIWA protocol every 4 hours with as needed Ativan - TOC consultation for resources - Daily thiamine, folic acid and multivitamin

## 2022-12-11 NOTE — H&P (View-Only) (Signed)
  GI Inpatient Consult Note  Reason for Consult: Melena, coffee-ground emesis, alcoholic cirrhosis    Attending Requesting Consult: Dr. Iulia Basaraba  History of Present Illness: William Beasley is a 26 y.o. male seen for evaluation of acute UGIB at the request of admitting hospitalist - Dr. Iulia Basaraba. Patient has a PMH of HTN, anxiety, depression, polysubstance abuse, and compensated hepatic cirrhosis 2/2 EtOH and hepatic sarcoidosis presented to the [facility] ED via EMS this morning for chief complaint of melena and coffee-ground emesis. Upon presentation to the ED, he was tachycardic (123), RR 18, and O2 sats 97% on room air. Labs were significant for hemoglobin 15.2, WBC 8.0, platelets 162K, AST 48, ALT 41, alk phos 92, total bilirubin 1.4, and INR 1.3 Ethanol level 100. Chest x-ray commented on focal opacity of left lung base favoring atelectasis. GI consulted for further evaluation and management.   Patient seen and examined this afternoon resting comfortably in no acute distress. He was admitted 4/17 - 4/23 last month for similar presentation, but EGD was unable to be performed due to positive cocaine on UDS. There were no signs of active bleeding during his previous admission. He reports since d/c from hospital he has not called to schedule f/u at [facility]. He has continued to drink on a daily basis. He reports he drank 20+ beers last night and also consumed appx 2 grams of cocaine. Last time he used cocaine was ~ 6 AM. He reports around 10:30 AM he developed 10+ episodes of coffee-ground emesis and 4 episodes of melanotic stool. His friends called EMS and he was brought to the ED. While in the room examining patient he had two additional episodes of melena. He reports he is having lower abdominal pain and chest pain.    Summary of GI Procedures:   EGD 04/12/2022 - Grade I varices found in lower third of esophagus, Z-line regular, mild portal hypertensive gastropathy, normal duodenum     Past Medical History:  Past Medical History:  Diagnosis Date   Cirrhosis of liver (HCC)    Hematemesis 11/20/2022    Problem List: Patient Active Problem List   Diagnosis Date Noted   Acute upper GI bleed 12/11/2022   Anxiety and depression 11/22/2022   RUQ abdominal pain 11/20/2022   Polysubstance abuse (HCC) 11/20/2022   Alcohol dependence with withdrawal (HCC) 11/03/2022   Alcoholic gastritis 11/03/2022   Thrombocytopenia (HCC) 11/03/2022   Hepatic granuloma associated with sarcoidosis 04/12/2022   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 No pertinent surgical history.  Allergies: No Known Allergies  Home Medications: (Not in a hospital admission)  Home medication reconciliation was completed with the patient.   Scheduled Inpatient Medications:    folic acid  1 mg Oral Daily   multivitamin with minerals  1 tablet Oral Daily   pantoprazole (PROTONIX) IV  40 mg Intravenous Q12H   sodium chloride flush  3 mL Intravenous Q12H   thiamine  100 mg Oral Daily   Or   thiamine  100 mg Intravenous Daily      Continuous Inpatient Infusions:    cefTRIAXone (ROCEPHIN)  IV 1 g (12/11/22 1430)   octreotide (SANDOSTATIN) 500 mcg in sodium chloride 0.9 % 250 mL (2 mcg/mL) infusion 50 mcg/hr (12/11/22 1219)    PRN Inpatient Medications:  HYDROmorphone (DILAUDID) injection, LORazepam **OR** LORazepam, ondansetron (ZOFRAN) IV  Family History: family history is not on file.  The patient's family history is negative for inflammatory  bowel disorders, GI malignancy, or solid organ transplantation.  Social History:   reports that he has never smoked. He has never used smokeless tobacco. He reports current alcohol use. He reports that he does not use drugs. The patient denies ETOH, tobacco, or drug use.   Review of Systems: Constitutional: Weight is stable.  Eyes: No changes in vision. ENT: No oral lesions, sore throat.  GI: see HPI.  Heme/Lymph: No easy bruising.  CV: No chest pain.  GU: No hematuria.  Integumentary: No rashes.  Neuro: No headaches.  Psych: No depression/anxiety.  Endocrine: No heat/cold intolerance.  Allergic/Immunologic: No urticaria.  Resp: No cough, SOB.  Musculoskeletal: No joint swelling.    Physical Examination: BP 125/80   Pulse 93   Temp 98.2 F (36.8 C) (Oral)   Resp 19   SpO2 100%  Gen: NAD, alert and oriented x 4 HEENT: PEERLA, EOMI, Neck: supple, no JVD or thyromegaly Chest: CTA bilaterally, no wheezes, crackles, or other adventitious sounds CV: RRR, no m/g/c/r Abd: soft, NT, ND, +BS in all four quadrants; no HSM, guarding, ridigity, or rebound tenderness Ext: no edema, well perfused with 2+ pulses, Skin: no rash or lesions noted Lymph: no LAD  Data: Lab Results  Component Value Date   WBC 8.0 12/11/2022   HGB 15.2 12/11/2022   HCT 45.1 12/11/2022   MCV 93.4 12/11/2022   PLT 162 12/11/2022   Recent Labs  Lab 12/11/22 1155  HGB 15.2   Lab Results  Component Value Date   NA 139 12/11/2022   K 3.6 12/11/2022   CL 105 12/11/2022   CO2 22 12/11/2022   BUN 15 12/11/2022   CREATININE 0.53 (L) 12/11/2022   Lab Results  Component Value Date   ALT 41 12/11/2022   AST 48 (H) 12/11/2022   ALKPHOS 92 12/11/2022   BILITOT 1.4 (H) 12/11/2022   Recent Labs  Lab 12/11/22 1155  APTT 34  INR 1.3*    Assessment/Plan:  26 y/o Hispanic male with a PMH of HTN, anxiety, depression, polysubstance abuse, and compensated hepatic cirrhosis 2/2 EtOH and hepatic  sarcoidosis presented to the [facility] ED via EMS this morning for chief complaint of acute UGIB   Coffee-ground emesis/Melena - concerning for UGIB. H&H stable currently, but he is having active melena. DDx includes esophageal varices, peptic ulcer disease, gastritis, esophagitis, AVMs, GAVE, neoplasm, polyp, etc.    Hepatic cirrhosis 2/2 EtOH + hepatic sarcoidosis - MELD-Na 11, Child's A (5)   Chest pain - EKG with sinus tachycardia, suspect potential coronary vasospasm 2/2 cocaine abuse versus anxiety    Lower abdominal pain   Portal hypertension - mild PHG, HSM, Gr I EV, thrombocytopenia    Polysubstance abuse - EtOH and cocaine    Liver lesion - LR-3, overdue for surveillance   Recommendations:   - Maintain 2 large bore IVs for access - Continue to monitor serial H&H. Transfuse for Hgb <7.0.  - Start Ceftriaxone 1 gram q24h given hx of cirrhosis and presumed GIB - Start Octreotide bolus and gtt for potential esophageal varices bleed - Continue Protonix IV   for gastric protection  - CIWA protocol as he is high-risk for withdrawal/DTs - Monitor for signs of overt gastrointestinal bleeding - EGD advised emergently given concerns for active GIB. - Discussed importance of complete alcohol cessation with patient. If he continues to drink EtOH this will lead to hepatic decompensation and ultimately death.  - Strictly NPO - GI actively following along with you  - Further recommendations after procedure  I reviewed the risks (including bleeding, perforation, infection, anesthesia complications, cardiac/respiratory complications), benefits and alternatives of EGD. Patient consents to proceed.    Thank you for the consult. Please call with questions or concerns.  Jannett Schmall M Ninel Abdella, PA-C [facility] [phone]  

## 2022-12-11 NOTE — Consult Note (Signed)
GI Inpatient Consult Note  Reason for Consult: Melena, coffee-ground emesis, alcoholic cirrhosis    Attending Requesting Consult: Dr. Verdene Lennert  History of Present Illness: William Beasley is a 27 y.o. male seen for evaluation of acute UGIB at the request of admitting hospitalist - Dr. Verdene Lennert. 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 Upon presentation to the ED, he was tachycardic (123), RR 18, and O2 sats 97% on room air. Labs were significant for hemoglobin 15.2, WBC 8.0, platelets 162K, AST 48, ALT 41, alk phos 92, total bilirubin 1.4, and INR 1.3 Ethanol level 100. Chest x-ray commented on focal opacity of left lung base favoring atelectasis. GI consulted for further evaluation and management.   Patient seen and examined this afternoon resting comfortably in no acute distress. He was admitted 4/17 - 4/23 last month for similar presentation, but EGD was unable to be performed due to positive cocaine on UDS. There were no signs of active bleeding during his previous admission. He reports since d/c from hospital he has not called to schedule f/u at [facility]. He has continued to drink on a daily basis. He reports he drank 20+ beers last night and also consumed appx 2 grams of cocaine. Last time he used cocaine was ~ 6 AM. He reports around 10:30 AM he developed 10+ episodes of coffee-ground emesis and 4 episodes of melanotic stool. His friends called EMS and he was brought to the ED. While in the room examining patient he had two additional episodes of melena. He reports he is having lower abdominal pain and chest pain.    Summary of GI Procedures:   EGD 04/12/2022 - Grade I varices found in lower third of esophagus, Z-line regular, mild portal hypertensive gastropathy, normal duodenum     Past Medical History:  Past Medical History:  Diagnosis Date   Cirrhosis of liver (HCC)    Hematemesis 11/20/2022    Problem List: Patient Active Problem List   Diagnosis Date Noted   Acute upper GI bleed 12/11/2022   Anxiety and depression 11/22/2022   RUQ abdominal pain 11/20/2022   Polysubstance abuse (HCC) 11/20/2022   Alcohol dependence with withdrawal (HCC) 11/03/2022   Alcoholic gastritis 11/03/2022   Thrombocytopenia (HCC) 11/03/2022   Hepatic granuloma associated with sarcoidosis 04/12/2022   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 No pertinent surgical history.  Allergies: No Known Allergies  Home Medications: (Not in a hospital admission)  Home medication reconciliation was completed with the patient.   Scheduled Inpatient Medications:    folic acid  1 mg Oral Daily   multivitamin with minerals  1 tablet Oral Daily   pantoprazole (PROTONIX) IV  40 mg Intravenous Q12H   sodium chloride flush  3 mL Intravenous Q12H   thiamine  100 mg Oral Daily   Or   thiamine  100 mg Intravenous Daily  Continuous Inpatient Infusions:    cefTRIAXone (ROCEPHIN)  IV 1 g (12/11/22 1430)   octreotide (SANDOSTATIN) 500 mcg in sodium chloride 0.9 % 250 mL (2 mcg/mL) infusion 50 mcg/hr (12/11/22 1219)    PRN Inpatient Medications:  HYDROmorphone (DILAUDID) injection, LORazepam **OR** LORazepam, ondansetron (ZOFRAN) IV  Family History: family history is not on file.  The patient's family history is negative for inflammatory  bowel disorders, GI malignancy, or solid organ transplantation.  Social History:   reports that he has never smoked. He has never used smokeless tobacco. He reports current alcohol use. He reports that he does not use drugs. The patient denies ETOH, tobacco, or drug use.   Review of Systems: Constitutional: Weight is stable.  Eyes: No changes in vision. ENT: No oral lesions, sore throat.  GI: see HPI.  Heme/Lymph: No easy bruising.  CV: No chest pain.  GU: No hematuria.  Integumentary: No rashes.  Neuro: No headaches.  Psych: No depression/anxiety.  Endocrine: No heat/cold intolerance.  Allergic/Immunologic: No urticaria.  Resp: No cough, SOB.  Musculoskeletal: No joint swelling.    Physical Examination: BP 125/80   Pulse 93   Temp 98.2 F (36.8 C) (Oral)   Resp 19   SpO2 100%  Gen: NAD, alert and oriented x 4 HEENT: PEERLA, EOMI, Neck: supple, no JVD or thyromegaly Chest: CTA bilaterally, no wheezes, crackles, or other adventitious sounds CV: RRR, no m/g/c/r Abd: soft, NT, ND, +BS in all four quadrants; no HSM, guarding, ridigity, or rebound tenderness Ext: no edema, well perfused with 2+ pulses, Skin: no rash or lesions noted Lymph: no LAD  Data: Lab Results  Component Value Date   WBC 8.0 12/11/2022   HGB 15.2 12/11/2022   HCT 45.1 12/11/2022   MCV 93.4 12/11/2022   PLT 162 12/11/2022   Recent Labs  Lab 12/11/22 1155  HGB 15.2   Lab Results  Component Value Date   NA 139 12/11/2022   K 3.6 12/11/2022   CL 105 12/11/2022   CO2 22 12/11/2022   BUN 15 12/11/2022   CREATININE 0.53 (L) 12/11/2022   Lab Results  Component Value Date   ALT 41 12/11/2022   AST 48 (H) 12/11/2022   ALKPHOS 92 12/11/2022   BILITOT 1.4 (H) 12/11/2022   Recent Labs  Lab 12/11/22 1155  APTT 34  INR 1.3*    Assessment/Plan:  27 y/o Hispanic male with a PMH of HTN, anxiety, depression, polysubstance abuse, and compensated hepatic cirrhosis 2/2 EtOH and hepatic  sarcoidosis presented to the [facility] ED via EMS this morning for chief complaint of acute UGIB   Coffee-ground emesis/Melena - concerning for UGIB. H&H stable currently, but he is having active melena. DDx includes esophageal varices, peptic ulcer disease, gastritis, esophagitis, AVMs, GAVE, neoplasm, polyp, etc.    Hepatic cirrhosis 2/2 EtOH + hepatic sarcoidosis - MELD-Na 11, Child's A (5)   Chest pain - EKG with sinus tachycardia, suspect potential coronary vasospasm 2/2 cocaine abuse versus anxiety    Lower abdominal pain   Portal hypertension - mild PHG, HSM, Gr I EV, thrombocytopenia    Polysubstance abuse - EtOH and cocaine    Liver lesion - LR-3, overdue for surveillance   Recommendations:   - Maintain 2 large bore IVs for access - Continue to monitor serial H&H. Transfuse for Hgb <7.0.  - Start Ceftriaxone 1 gram q24h given hx of cirrhosis and presumed GIB - Start Octreotide bolus and gtt for potential esophageal varices bleed - Continue Protonix IV  for gastric protection  - CIWA protocol as he is high-risk for withdrawal/DTs - Monitor for signs of overt gastrointestinal bleeding - EGD advised emergently given concerns for active GIB. - Discussed importance of complete alcohol cessation with patient. If he continues to drink EtOH this will lead to hepatic decompensation and ultimately death.  - Strictly NPO - GI actively following along with you  - Further recommendations after procedure  I reviewed the risks (including bleeding, perforation, infection, anesthesia complications, cardiac/respiratory complications), benefits and alternatives of EGD. Patient consents to proceed.    Thank you for the consult. Please call with questions or concerns.  Gilda Crease, PA-C [facility] [phone]

## 2022-12-11 NOTE — Op Note (Addendum)
[facility] Patient Name: William Beasley Procedure Date: 12/11/2022 5:02 PM MRN: [id number] Account #: [id number] Date of Birth: 1996-01-28 Admit Type: Inpatient Age: 27 Room: [facility] ENDO ROOM 2 Gender: Male Note Status: Supervisor Override Instrument Name: Laurette Schimke [id number] Procedure:             Upper GI endoscopy Indications:           Coffee-ground emesis, Melena, Portal hypertension with                         UGI bleeding and suspected esophageal varices Providers:             Boykin Nearing. Hasset Chaviano MD, MD Medicines:             Propofol per Anesthesia Complications:         No immediate complications. Estimated blood loss: None. Procedure:             Pre-Anesthesia Assessment:                        - The risks and benefits of the procedure and the                         sedation options and risks were discussed with the                         patient. All questions were answered and informed                         consent was obtained.                        - Patient identification and proposed procedure were                         verified prior to the procedure by the nurse. The                         procedure was verified in the procedure room.                        - ASA Grade Assessment: III - A patient with severe                         systemic disease.                        - After reviewing the risks and benefits, the patient                         was deemed in satisfactory condition to undergo the                         procedure.                        After obtaining informed consent, the endoscope was                         passed under direct vision. Throughout the procedure,  the patient's blood pressure, pulse, and oxygen                         saturations were monitored continuously. The Endoscope                         was introduced through the mouth, and advanced to the                          third part of duodenum. The upper GI endoscopy was                         accomplished without difficulty. The patient tolerated                         the procedure well. Findings:      Grade I varices were found in the middle third of the esophagus and in       the lower third of the esophagus. No stigmata of active or recent       bleeding (red wale signs, cherry red spots) were noted. Estimated blood       loss: none.      The exam of the esophagus was otherwise normal.      Moderate portal hypertensive gastropathy was found in the entire       examined stomach.      There is no endoscopic evidence of bleeding, ulceration, varices,       angioectasia or Dieulafoy lesions in the entire examined stomach.      Hematin (altered blood/coffee-ground-like material) was found in the       gastric body.      The examined duodenum was normal.      The exam was otherwise without abnormality. Impression:            - Grade I esophageal varices.                        - Portal hypertensive gastropathy.                        - Hematin (altered blood/coffee-ground-like material)                         in the gastric body.                        - Normal examined duodenum.                        - The examination was otherwise normal.                        - No specimens collected. Recommendation:        - Return patient to hospital ward for observation.                        - Perform a colonoscopy at the next available                         appointment.                        -  NPO.                        - Continue present medications. Procedure Code(s):     --- Professional ---                        [id number], Esophagogastroduodenoscopy, flexible,                         transoral; diagnostic, including collection of                         specimen(s) by brushing or washing, when performed                         (separate procedure) Diagnosis Code(s):     ---  Professional ---                        K92.2, Gastrointestinal hemorrhage, unspecified                        K76.6, Portal hypertension                        K92.1, Melena (includes Hematochezia)                        K92.0, Hematemesis                        K31.89, Other diseases of stomach and duodenum                        I85.10, Secondary esophageal varices without bleeding CPT copyright 2022 American Medical Association. All rights reserved. The codes documented in this report are preliminary and upon coder review may  be revised to meet current compliance requirements. Stanton Kidney MD, MD 12/11/2022 5:41:52 PM This report has been signed electronically. Number of Addenda: 0 Note Initiated On: 12/11/2022 5:02 PM Estimated Blood Loss:  Estimated blood loss: none.      [facility]

## 2022-12-11 NOTE — ED Notes (Signed)
Pt to procedure.

## 2022-12-11 NOTE — ED Notes (Signed)
This Rn has educated the pt on his NPO status for GI recovery, the pt has been brought food by his mother and has continued to eat the food after being educated by this RN of the risk at this time. Close monitoring continued.

## 2022-12-11 NOTE — ED Notes (Signed)
Hospitalist in room.

## 2022-12-11 NOTE — Assessment & Plan Note (Signed)
Predominantly alcohol and cocaine use.  Interested in resources for cessation.  -TOC consultation

## 2022-12-11 NOTE — TOC CM/SW Note (Signed)
CSW acknowledges consult for SA resources. TOC gave patient resources on 4/21.  Charlynn Court, CSW [phone]

## 2022-12-11 NOTE — Transfer of Care (Signed)
Immediate Anesthesia Transfer of Care Note  Patient: William Beasley  Procedure(s) Performed: ESOPHAGOGASTRODUODENOSCOPY (EGD) WITH PROPOFOL  Patient Location: PACU and Endoscopy Unit  Anesthesia Type:General  Level of Consciousness: drowsy and patient cooperative  Airway & Oxygen Therapy: Patient Spontanous Breathing  Post-op Assessment: Report given to RN and Post -op Vital signs reviewed and stable  Post vital signs: Reviewed and stable  Last Vitals:  Vitals Value Taken Time  BP 129/81 12/11/22 1743  Temp 35.7 C 12/11/22 1743  Pulse 104 12/11/22 1743  Resp 18 12/11/22 1743  SpO2 93 % 12/11/22 1743    Last Pain:  Vitals:   12/11/22 1743  TempSrc: Temporal  PainSc: 0-No pain         Complications: No notable events documented.

## 2022-12-11 NOTE — Assessment & Plan Note (Signed)
Multifactorial in the setting of alcohol use disorder and hepatic sarcoidosis (biopsy proven).  Not currently a transplant candidate given continued alcohol

## 2022-12-11 NOTE — ED Notes (Addendum)
Pt resting with eyes closed at this time. Pt RR equal, unlabored and symmetrical.

## 2022-12-11 NOTE — H&P (Signed)
History and Physical    Patient: William Beasley WUJ:[id number] DOB: 1995-11-28 DOA: 12/11/2022 DOS: the patient was seen and examined on 12/11/2022 PCP: [facility]  Patient coming from: Home  Chief Complaint:  Chief Complaint  Patient presents with   Addiction Problem   Alcohol Problem   Rectal Bleeding   HPI: William Beasley is a 27 y.o. male with medical history significant of cirrhosis 2/2 hepatic sarcoidosis and AUD with secondary varices, alcohol and cocaine use disorder, pulmonary sarcoidosis, who presents to the ED due to melena.  William Beasley states that he recently began drinking again after he lost custody of his 2 children and due to strife between him and his partner.  He drank approximately 30 beers yesterday with last drink around 6 AM.  He also used cocaine.  He did not experience sudden onset nausea with repetitive large-volume emesis that was both bright red and with coffee grounds.  Patient has pictures available.  In addition, he had at least 1 large-volume melanotic stool.  He endorses epigastric and right upper quadrant abdominal pain that radiates upwards, shortness of breath.  He denies any palpitations, lower extremity swelling, orthopnea, fever, chills.  He would be interested in trying to quit drinking again.  To light for admission  ED course: On arrival to the ED, patient was normotensive at 127/72 with heart rate ranging between 91 and 105.  He was afebrile at 98.2.  He was saturating between 90 to 100% on room air.  Initial workup notable for normal hemoglobin at 15.2, platelets of 162, INR of 1.3, AST of 48, total bilirubin 1.4, creatinine 0.53, BUN of 15 and GFR above 60. Chest x-ray was obtained but limited due to poor inflation.  Patient started on Protonix and octreotide.  TRH contacted for admission.  Review of Systems: As mentioned in the history of present illness. All other systems reviewed and are negative.  Past Medical History:   Diagnosis Date   Cirrhosis of liver (HCC)    Hematemesis 11/20/2022   History reviewed. No pertinent surgical history.  Social History:  reports that he has never smoked. He has never used smokeless tobacco. He reports current alcohol use. He reports that he does not use drugs.  No Known Allergies  History reviewed. No pertinent family history.  Prior to Admission medications   Medication Sig Start Date End Date Taking? Authorizing Provider  carvedilol (COREG) 6.25 MG tablet Take 1 tablet (6.25 mg total) by mouth 2 (two) times daily. 03/14/22   Arnetha Courser, MD  Cholecalciferol 25 MCG (1000 UT) capsule Take 1,000 Units by mouth daily. 07/02/22   [provider]  escitalopram (LEXAPRO) 10 MG tablet Take 1 tablet (10 mg total) by mouth daily. 11/26/22   Pennie Banter, DO  hydrOXYzine (ATARAX) 25 MG tablet Take 1 tablet (25 mg total) by mouth 3 (three) times daily as needed for anxiety. 11/26/22   Pennie Banter, DO  ondansetron (ZOFRAN-ODT) 8 MG disintegrating tablet Take 1 tablet (8 mg total) by mouth every 8 (eight) hours as needed for nausea or vomiting. 11/26/22   Esaw Grandchild A, DO  pantoprazole (PROTONIX) 40 MG tablet Take 1 tablet (40 mg total) by mouth 2 (two) times daily. 11/26/22 01/25/23  Esaw Grandchild A, DO  thiamine (VITAMIN B1) 100 MG tablet Take 1 tablet (100 mg total) by mouth daily. 11/07/22   Kirstie Peri, MD  traZODone (DESYREL) 50 MG tablet Take 50 mg by mouth at bedtime. 05/15/22  [provider]    Physical Exam: Vitals:   12/11/22 1225 12/11/22 1230 12/11/22 1300 12/11/22 1427  BP:  126/87 127/72 125/80  Pulse:  85 88 93  Resp:  (!) 23 19   Temp: 98.2 F (36.8 C)     TempSrc: Oral     SpO2:  92% 100%    Physical Exam Vitals and nursing note reviewed.  Constitutional:      Appearance: He is normal weight. He is ill-appearing. He is not toxic-appearing.  HENT:     Head: Normocephalic and atraumatic.  Cardiovascular:     Rate and  Rhythm: Regular rhythm. Tachycardia present.     Heart sounds: No murmur heard.    No gallop.  Pulmonary:     Effort: Pulmonary effort is normal. No respiratory distress.     Breath sounds: Normal breath sounds. No wheezing, rhonchi or rales.  Abdominal:     General: There is no distension.     Palpations: Abdomen is soft.     Tenderness: There is abdominal tenderness (Epigastric and right upper quadrant).  Musculoskeletal:     Right lower leg: No edema.     Left lower leg: No edema.  Skin:    General: Skin is warm and dry.  Neurological:     Mental Status: He is alert.     Comments: No fine tremors  Psychiatric:        Mood and Affect: Mood normal.    Data Reviewed: CBC with WBC of 8.0, hemoglobin 15.2, MCV of 93 and platelets 162 CMP with sodium of 139, potassium 3.6, bicarb 22, glucose 105, BUN 15, creatinine 0.53, AST 48, ALT 41, total bilirubin 1.4 and GFR above 60 INR elevated at 1.3 PTT within normal limits at 34 Troponin within normal limits at 6  EKG personally reviewed.  Sinus rhythm with a rate of 118.  J-point elevation in lateral leads, but no ischemic appearing changes.  DG Chest Port 1 View  Result Date: 12/11/2022 CLINICAL DATA:  Shortness of breath EXAM: PORTABLE CHEST 1 VIEW COMPARISON:  03/12/2022 x-ray FINDINGS: Underinflation. Borderline cardiopericardial silhouette with bronchovascular crowding. No pneumothorax, effusion or edema. There is some bandlike opacity in the left lung base. Atelectasis is favored over infiltrate recommend follow up. Overlapping cardiac leads. IMPRESSION: Poor inflation.  Bronchovascular crowding. Focal opacity left lung base. Atelectasis is favored over infiltrate but recommend follow-up Electronically Signed   By: Karen Kays M.D.   On: 12/11/2022 13:06    Results are pending, will review when available.  Assessment and Plan:  * Acute upper GI bleed Patient presenting with several hour history of large-volume hematemesis with  melena.  History of cirrhosis with secondary varices.  Hemodynamically stable at this time with stable hemoglobin.  - GI consulted; appreciate their recommendations - Continue octreotide infusion - Continue Protonix 40 mg IV twice daily - Start SBP prophylaxis - CBC twice daily - N.p.o.  Alcohol dependence with withdrawal (HCC) History of alcohol use disorder that is severe and active.  Ethanol level pending, however patient states his last drink was at 6 AM.  - CIWA protocol every 4 hours with as needed Ativan - TOC consultation for resources - Daily thiamine, folic acid and multivitamin  Liver cirrhosis (HCC) Multifactorial in the setting of alcohol use disorder and hepatic sarcoidosis (biopsy proven).  Not currently a transplant candidate given continued alcohol  Polysubstance abuse (HCC) Predominantly alcohol and cocaine use.  Interested in resources for cessation.  -TOC consultation  Advance Care Planning:   Code Status: Full Code   Consults: Gastroenterology  Family Communication: No family at bedside.   Severity of Illness: The appropriate patient status for this patient is INPATIENT. Inpatient status is judged to be reasonable and necessary in order to provide the required intensity of service to ensure the patient's safety. The patient's presenting symptoms, physical exam findings, and initial radiographic and laboratory data in the context of their chronic comorbidities is felt to place them at high risk for further clinical deterioration. Furthermore, it is not anticipated that the patient will be medically stable for discharge from the hospital within 2 midnights of admission.   * I certify that at the point of admission it is my clinical judgment that the patient will require inpatient hospital care spanning beyond 2 midnights from the point of admission due to high intensity of service, high risk for further deterioration and high frequency of surveillance  required.*  Author: Verdene Lennert, MD 12/11/2022 2:58 PM  For on call review [email or web address].

## 2022-12-11 NOTE — Anesthesia Postprocedure Evaluation (Signed)
Anesthesia Post Note  Patient: William Beasley  Procedure(s) Performed: ESOPHAGOGASTRODUODENOSCOPY (EGD) WITH PROPOFOL  Patient location during evaluation: PACU Anesthesia Type: General Level of consciousness: awake and alert Pain management: pain level controlled Vital Signs Assessment: post-procedure vital signs reviewed and stable Respiratory status: spontaneous breathing, nonlabored ventilation, respiratory function stable and patient connected to nasal cannula oxygen Cardiovascular status: blood pressure returned to baseline and stable Postop Assessment: no apparent nausea or vomiting Anesthetic complications: no   No notable events documented.   Last Vitals:  Vitals:   12/11/22 1743 12/11/22 1753  BP: 129/81 124/87  Pulse: (!) 104 (!) 106  Resp: 18 17  Temp: (!) 35.7 C   SpO2: 93% 94%    Last Pain:  Vitals:   12/11/22 1803  TempSrc:   PainSc: 0-No pain                 Corinda Gubler

## 2022-12-11 NOTE — ED Notes (Addendum)
First Nurse Note: Patient to ED via [facility] from home for vomiting coffee ground emesis and has William Beasley tarry stools. Patient drank 25 beers last PM and used cocaine. Hx of cirrhosis. Abd distended per EMS. 4mg  zofran given by EMS.

## 2022-12-11 NOTE — ED Provider Notes (Addendum)
[facility] Provider Note    Event Date/Time   First MD Initiated Contact with Patient 12/11/22 1147     (approximate)   History   Addiction Problem, Alcohol Problem, and Rectal Bleeding   HPI  William Beasley is a 27 y.o. male with a history of alcohol abuse, polysubstance abuse, cirrhosis who presents with epigastric pain hematemesis and black stools.  This started this morning.  He does admit to using cocaine and drinking alcohol this morning review of records demonstrates an admission for similar presentation, discharged on 23 April     Physical Exam   Triage Vital Signs: ED Triage Vitals  Enc Vitals Group     BP --      Pulse Rate 12/11/22 1150 (!) 123     Resp 12/11/22 1150 18     Temp --      Temp src --      SpO2 12/11/22 1150 97 %     Weight --      Height --      Head Circumference --      Peak Flow --      Pain Score 12/11/22 1152 10     Pain Loc --      Pain Edu? --      Excl. in GC? --     Most recent vital signs: Vitals:   12/11/22 1230 12/11/22 1300  BP: 126/87 127/72  Pulse: 85 88  Resp: (!) 23 19  Temp:    SpO2: 92% 100%     General: Awake, anxious CV:  Good peripheral perfusion.  Tachycardia Resp:  Normal effort.  Abd:  No distention.  Soft Other:     ED Results / Procedures / Treatments   Labs (all labs ordered are listed, but only abnormal results are displayed) Labs Reviewed  CBC - Abnormal; Notable for the following components:      Result Value   RDW 15.9 (*)    All other components within normal limits  COMPREHENSIVE METABOLIC PANEL - Abnormal; Notable for the following components:   Glucose, Bld 105 (*)    Creatinine, Ser 0.53 (*)    Total Protein 8.4 (*)    AST 48 (*)    Total Bilirubin 1.4 (*)    All other components within normal limits  PROTIME-INR - Abnormal; Notable for the following components:   Prothrombin Time 16.3 (*)    INR 1.3 (*)    All other components within normal limits   ETHANOL - Abnormal; Notable for the following components:   Alcohol, Ethyl (B) 100 (*)    All other components within normal limits  APTT  TYPE AND SCREEN  TROPONIN I (HIGH SENSITIVITY)     EKG  ED ECG REPORT I, Jene Every, the attending physician, personally viewed and interpreted this ECG.  Date: 12/11/2022  Rhythm: Sinus tachycardia QRS Axis: normal Intervals: normal ST/T Wave abnormalities: normal Narrative Interpretation: no evidence of acute ischemia    RADIOLOGY     PROCEDURES:  Critical Care performed: yes  CRITICAL CARE Performed by: Jene Every   Total critical care time: 30 minutes  Critical care time was exclusive of separately billable procedures and treating other patients.  Critical care was necessary to treat or prevent imminent or life-threatening deterioration.  Critical care was time spent personally by me on the following activities: development of treatment plan with patient and/or surrogate as well as nursing, discussions with consultants, evaluation of patient's response to  treatment, examination of patient, obtaining history from patient or surrogate, ordering and performing treatments and interventions, ordering and review of laboratory studies, ordering and review of radiographic studies, pulse oximetry and re-evaluation of patient's condition.   Procedures   MEDICATIONS ORDERED IN ED: Medications  octreotide (SANDOSTATIN) 2 mcg/mL load via infusion 50 mcg (50 mcg Intravenous Bolus from Bag 12/11/22 1219)    And  octreotide (SANDOSTATIN) 500 mcg in sodium chloride 0.9 % 250 mL (2 mcg/mL) infusion (50 mcg/hr Intravenous New Bag/Given 12/11/22 1219)  LORazepam (ATIVAN) tablet 1-4 mg (has no administration in time range)    Or  LORazepam (ATIVAN) injection 1-4 mg (has no administration in time range)  thiamine (VITAMIN B1) tablet 100 mg (has no administration in time range)    Or  thiamine (VITAMIN B1) injection 100 mg (has no  administration in time range)  folic acid (FOLVITE) tablet 1 mg (has no administration in time range)  multivitamin with minerals tablet 1 tablet (has no administration in time range)  cefTRIAXone (ROCEPHIN) 1 g in sodium chloride 0.9 % 100 mL IVPB (has no administration in time range)  0.9 %  sodium chloride infusion ( Intravenous New Bag/Given 12/11/22 1216)  pantoprazole (PROTONIX) injection 40 mg (40 mg Intravenous Given 12/11/22 1217)  LORazepam (ATIVAN) injection 1 mg (1 mg Intravenous Given 12/11/22 1217)     IMPRESSION / MDM / ASSESSMENT AND PLAN / ED COURSE  I reviewed the triage vital signs and the nursing notes. Patient's presentation is most consistent with acute presentation with potential threat to life or bodily function.  Patient presents with gastric pain, chest discomfort, hematemesis, dark stools in the setting of cirrhosis, substance abuse, cocaine use.  Concerning for variceal bleeding versus PUD, will start IV Protonix, IV octreotide drip, will give IV Ativan to help calm him and find out alcohol withdrawal, IV fluid bolus  Type and screen ordered, labs ordered, blood pressure stable at this time ----------------------------------------- 1:01 PM on 12/11/2022 ----------------------------------------- Patient's heart rate has improved after Ativan, he is resting quietly, some tachypnea, chest x-ray ordered.  No further episodes of hematemesis, will consult hospitalist for admission      FINAL CLINICAL IMPRESSION(S) / ED DIAGNOSES   Final diagnoses:  Acute GI bleeding  Cirrhosis of liver without ascites, unspecified hepatic cirrhosis type (HCC)     Rx / DC Orders   ED Discharge Orders     None        Note:  This document was prepared using Dragon voice recognition software and may include unintentional dictation errors.   Jene Every, MD 12/11/22 1411    Jene Every, MD 12/11/22 [phone]

## 2022-12-11 NOTE — Interval H&P Note (Signed)
History and Physical Interval Note:  12/11/2022 6:09 PM  William Beasley  has presented today for surgery, with the diagnosis of Melena/Coffee-ground emesis/alcoholic cirrhosis, epigastric abdominal pain.  The various methods of treatment have been discussed with the patient and family. After consideration of risks, benefits and other options for treatment, the patient has consented to  Procedure(s): ESOPHAGOGASTRODUODENOSCOPY (EGD) WITH PROPOFOL (N/A) as a surgical intervention.  The patient's history has been reviewed, patient examined, no change in status, stable for surgery.  I have reviewed the patient's chart and labs.  Questions were answered to the patient's satisfaction.     [place], [place]

## 2022-12-11 NOTE — Assessment & Plan Note (Signed)
Patient presenting with several hour history of large-volume hematemesis with melena.  History of cirrhosis with secondary varices.  Hemodynamically stable at this time with stable hemoglobin.  - GI consulted; appreciate their recommendations - Continue octreotide infusion - Continue Protonix 40 mg IV twice daily - Start SBP prophylaxis - CBC twice daily - N.p.o.

## 2022-12-11 NOTE — ED Triage Notes (Signed)
Pt presents to ED via [facility] with c/o of ETOH abuse (HX of same-liver cirrhosis) last drink 0600 this am and cocaine use at 0600 this morning. Pt states black tarry stools and pt states "it was gallons". Pt is lethargic and slow to respond.

## 2022-12-11 NOTE — Anesthesia Procedure Notes (Addendum)
Procedure Name: Intubation Date/Time: 12/11/2022 5:20 PM  Performed by: Omer Jack, CRNAPre-anesthesia Checklist: Patient identified, Patient being monitored, Timeout performed, Emergency Drugs available and Suction available Patient Re-evaluated:Patient Re-evaluated prior to induction Oxygen Delivery Method: Circle system utilized Preoxygenation: Pre-oxygenation with 100% oxygen Induction Type: IV induction, Rapid sequence and Cricoid Pressure applied Ventilation: Mask ventilation without difficulty Laryngoscope Size: Mac and 3 Grade View: Grade I Tube type: Oral Tube size: 7.0 mm Number of attempts: 1 Airway Equipment and Method: Stylet Placement Confirmation: ETT inserted through vocal cords under direct vision, positive ETCO2 and breath sounds checked- equal and bilateral Secured at: 21 cm Tube secured with: Tape Dental Injury: Teeth and Oropharynx as per pre-operative assessment  Comments: Modified rapid sequence, few breaths of very gentle ventilation, airway pressures no more than 10cmH2O

## 2022-12-11 NOTE — Anesthesia Preprocedure Evaluation (Signed)
Anesthesia Evaluation  Patient identified by MRN, date of birth, ID band Patient awake  General Assessment Comment:  Patient with signs of acute cocaine intoxication (large use a few hours ago, tachycardia, subjective comlpaints of chest pain, though it is reproducible on palpation to epigastrum which would make esophageal pathology in the differential).   Patient appears jittery in preop. AO x 3 and answers questions appropriately however.  Reviewed: Allergy & Precautions, NPO status , Patient's Chart, lab work & pertinent test results  History of Anesthesia Complications Negative for: history of anesthetic complications  Airway Mallampati: II  TM Distance: >3 FB Neck ROM: Full    Dental no notable dental hx. (+) Teeth Intact   Pulmonary neg pulmonary ROS, neg sleep apnea, neg COPD, Patient abstained from smoking.Not current smoker   Pulmonary exam normal breath sounds clear to auscultation       Cardiovascular Exercise Tolerance: Good METS(-) hypertension(-) CAD and (-) Past MI negative cardio ROS (-) dysrhythmias  Rhythm:Regular Rate:Normal - Systolic murmurs EKG sinus tachycardia   Neuro/Psych  PSYCHIATRIC DISORDERS Anxiety Depression    negative neurological ROS     GI/Hepatic ,neg GERD  ,,(+) Cirrhosis   Esophageal Varices  substance abuse  alcohol use, cocaine use and marijuana use, Hepatitis -Last used cocaine this morning. 20 beers last night, some beers at 5am.   Endo/Other  neg diabetes    Renal/GU negative Renal ROS     Musculoskeletal   Abdominal  (+) + obese  Peds  Hematology   Anesthesia Other Findings Past Medical History: No date: Cirrhosis of liver (HCC) 11/20/2022: Hematemesis  Reproductive/Obstetrics                              Anesthesia Physical Anesthesia Plan  ASA: 4 and emergent  Anesthesia Plan: General   Post-op Pain Management:    Induction:  Intravenous and Rapid sequence  PONV Risk Score and Plan: 1 and Ondansetron, Dexamethasone and Midazolam  Airway Management Planned: Oral ETT and Video Laryngoscope Planned  Additional Equipment: None  Intra-op Plan:   Post-operative Plan: Extubation in OR  Informed Consent: I have reviewed the patients History and Physical, chart, labs and discussed the procedure including the risks, benefits and alternatives for the proposed anesthesia with the patient or authorized representative who has indicated his/her understanding and acceptance.     Dental advisory given  Plan Discussed with: CRNA and Surgeon  Anesthesia Plan Comments: (Discussed with GI physician Dr Norma Fredrickson who deems this case an emergency that cannot wait until the morning for signs of acute cocaine intoxicationto dissipate, despite the increased risk below. Plan on RSI, last vomiting was this morning. Will avoid succinylcholine due to concurrent metabolization of cocaine and succ with pseudocholinesterase.  Discussed risks of anesthesia with patient, including PONV, sore throat, lip/dental/eye damage. Rare risks discussed as well, such as cardiorespiratory and neurological sequelae, and allergic reactions. Discussed the role of CRNA in patient's perioperative care. Patient understands. Patient counseled on being higher risk for anesthesia due to comorbidities: acute cocaine intoxication. Patient was told about increased risk of cardiac and respiratory events, including death. )         Anesthesia Quick Evaluation

## 2022-12-12 ENCOUNTER — Encounter: Payer: Self-pay | Admitting: Internal Medicine

## 2022-12-12 DIAGNOSIS — K922 Gastrointestinal hemorrhage, unspecified: Secondary | ICD-10-CM | POA: Diagnosis not present

## 2022-12-12 LAB — COMPREHENSIVE METABOLIC PANEL
ALT: 32 U/L (ref 0–44)
AST: 37 U/L (ref 15–41)
Albumin: 3.5 g/dL (ref 3.5–5.0)
Alkaline Phosphatase: 76 U/L (ref 38–126)
Anion gap: 9 (ref 5–15)
BUN: 9 mg/dL (ref 6–20)
CO2: 26 mmol/L (ref 22–32)
Calcium: 8.6 mg/dL — ABNORMAL LOW (ref 8.9–10.3)
Chloride: 102 mmol/L (ref 98–111)
Creatinine, Ser: 0.61 mg/dL (ref 0.61–1.24)
GFR, Estimated: >60 mL/min (ref >60)
Glucose, Bld: 140 mg/dL — ABNORMAL HIGH (ref 70–99)
Potassium: 3.8 mmol/L (ref 3.5–5.1)
Sodium: 137 mmol/L (ref 135–145)
Total Bilirubin: 1.5 mg/dL — ABNORMAL HIGH (ref 0.3–1.2)
Total Protein: 7.3 g/dL (ref 6.5–8.1)

## 2022-12-12 LAB — PROTIME-INR
INR: 1.3 — ABNORMAL HIGH (ref 0.8–1.2)
Prothrombin Time: 15.9 seconds — ABNORMAL HIGH (ref 11.4–15.2)

## 2022-12-12 LAB — CBC
HCT: 39 % (ref 39.0–52.0)
Hemoglobin: 12.9 g/dL — ABNORMAL LOW (ref 13.0–17.0)
MCH: 31.9 pg (ref 26.0–34.0)
MCHC: 33.1 g/dL (ref 30.0–36.0)
MCV: 96.5 fL (ref 80.0–100.0)
Platelets: 103 10*3/uL — ABNORMAL LOW (ref 150–400)
RBC: 4.04 MIL/uL — ABNORMAL LOW (ref 4.22–5.81)
RDW: 15.3 % (ref 11.5–15.5)
WBC: 4.2 10*3/uL (ref 4.0–10.5)
nRBC: 0 % (ref 0.0–0.2)

## 2022-12-12 MED ORDER — ESCITALOPRAM OXALATE 10 MG PO TABS
10.0000 mg | ORAL_TABLET | Freq: Every day | ORAL | Status: DC
  Administered 2022-12-12 – 2022-12-14 (×2): 10 mg via ORAL
  Filled 2022-12-12 (×2): qty 1

## 2022-12-12 MED ORDER — POLYETHYLENE GLYCOL 3350 17 GM/SCOOP PO POWD
1.0000 | Freq: Once | ORAL | Status: AC
  Administered 2022-12-12: 255 g via ORAL
  Filled 2022-12-12: qty 255

## 2022-12-12 NOTE — Progress Notes (Signed)
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  Patient reports he recently began drinking again after he lost custody of his 2 children due to conflict between him and his partner.  He drank approximately 30 beers yesterday with the last drink was around 6 AM.  He also used cocaine.  He did report nausea and large-volume emesis that was both bright red and with coffee-ground's.  Patient also report large-volume melanotic stools.  He also reports having epigastric and upper upper quadrant abdominal pain radiating towards the back.  Workup in the ED reveals tachycardia, hemoglobin was 15.2 on arrival.  Patient was admitted and was started on Protonix and octreotide.  GI is consulted.  Patient underwent EGD found to have grade 1 esophageal varices which was banded.  Patient is scheduled to have colonoscopy tomorrow.  Assessment & Plan:   Principal Problem:   Acute upper GI bleed Active Problems:   Alcohol dependence with withdrawal (HCC)   Liver cirrhosis (HCC)   Polysubstance abuse (HCC)  Acute Upper GI bleed: Patient presented with several episodes of large-volume hematemesis and melena. He has history of cirrhosis with secondary varices.   Hemodynamically stable at this time with stable hemoglobin. GI is consulted.  Patient underwent EGD which showed grade 1 esophageal varices, portal hypertension. Continue octreotide infusion for 72 hours Continue Protonix 40 mg IV twice daily Continue ceftriaxone 1 g given history of cirrhosis and presumed GIB. Monitor H&H.  Started on clear liquid diet. Scheduled for colonoscopy tomorrow.   Alcohol dependence with  withdrawal: Patient has history of alcohol use disorder which is severe and active. Ethanol level 100.  However patient states his last drink was at 6 AM. Continue CIWA protocol every 4 hours with as needed Ativan TOC consultation for resources Continue Daily thiamine, folic acid and multivitamin   Liver cirrhosis (HCC): Multifactorial in the setting of alcohol use disorder and hepatic sarcoidosis (biopsy proven).   Not currently a transplant candidate given ongoing alcohol use.   Polysubstance abuse (HCC) Predominantly alcohol and cocaine use.  Interested in resources for cessation. TOC consultation.  DVT prophylaxis:  SCDs Code Status: Full code Family Communication:No family at bed side. Disposition Plan:    Status is: Inpatient Remains inpatient appropriate because:   Admitted for black-colored stools, GI is consulted,  Patient underwent EGD,  found to have varices which was banded.  He is scheduled for colonoscopy tomorrow.   Consultants:  Gastroenterology  Procedures: EGD, Colonoscopy  Antimicrobials:  Anti-infectives (From admission, onward)    Start     Dose/Rate Route Frequency Ordered Stop   12/11/22 1400  cefTRIAXone (ROCEPHIN) 1 g in sodium chloride 0.9 % 100 mL IVPB        1 g 200 mL/hr over 30 Minutes Intravenous Every 24 hours 12/11/22 1327        Subjective: Patient was seen and examined at bedside.  Overnight events noted. Patient was having breakfast, s/p EGD found to have esophageal varices. He appears depressed states he is missing his kids.  Patient continues to drink EtOH.  Objective: Vitals:   12/12/22 0506 12/12/22 0957 12/12/22 1000 12/12/22 1007  BP: 114/66  101/89 101/89  Pulse: 71  88  64  Resp: 16  16   Temp: 97.8 F (36.6 C) 97.7 F (36.5 C)    TempSrc: Oral Oral    SpO2: 94%  97%     Intake/Output Summary (Last 24 hours) at 12/12/2022 1130 Last data filed at 12/12/2022 1014 Gross per 24 hour  Intake 750.92 ml  Output --  Net  750.92 ml   There were no vitals filed for this visit.  Examination:  General exam: Appears calm and comfortable, deconditioned, not in any distress. Respiratory system: Clear to auscultation. Respiratory effort normal.  RR 15 Cardiovascular system: S1 & S2 heard, RRR. No JVD, murmurs, rubs, gallops or clicks. No pedal edema. Gastrointestinal system: Abdomen is soft, non tender, non distended, BS+ Central nervous system: Alert and oriented x 3. No focal neurological deficits. Extremities: No edema, no cyanosis, no clubbing Skin: No rashes, lesions or ulcers Psychiatry: Judgement and insight appear normal. Mood & affect appropriate.     Data Reviewed: I have personally reviewed following labs and imaging studies  CBC: Recent Labs  Lab 12/11/22 1155 12/12/22 0458  WBC 8.0 4.2  HGB 15.2 12.9*  HCT 45.1 39.0  MCV 93.4 96.5  PLT 162 103*   Basic Metabolic Panel: Recent Labs  Lab 12/11/22 1155 12/12/22 0458  NA 139 137  K 3.6 3.8  CL 105 102  CO2 22 26  GLUCOSE 105* 140*  BUN 15 9  CREATININE 0.53* 0.61  CALCIUM 8.9 8.6*   GFR: CrCl cannot be calculated (Unknown ideal weight.). Liver Function Tests: Recent Labs  Lab 12/11/22 1155 12/12/22 0458  AST 48* 37  ALT 41 32  ALKPHOS 92 76  BILITOT 1.4* 1.5*  PROT 8.4* 7.3  ALBUMIN 4.0 3.5   No results for input(s): "LIPASE", "AMYLASE" in the last 168 hours. No results for input(s): "AMMONIA" in the last 168 hours. Coagulation Profile: Recent Labs  Lab 12/11/22 1155 12/12/22 0458  INR 1.3* 1.3*   Cardiac Enzymes: No results for input(s): "CKTOTAL", "CKMB", "CKMBINDEX", "TROPONINI" in the last 168 hours. BNP (last 3 results) No results for input(s): "PROBNP" in the last 8760 hours. HbA1C: No results for input(s): "HGBA1C" in the last 72 hours. CBG: No results for input(s): "GLUCAP" in the last 168 hours. Lipid Profile: No results for input(s): "CHOL", "HDL", "LDLCALC", "TRIG", "CHOLHDL", "LDLDIRECT" in the  last 72 hours. Thyroid Function Tests: No results for input(s): "TSH", "T4TOTAL", "FREET4", "T3FREE", "THYROIDAB" in the last 72 hours. Anemia Panel: No results for input(s): "VITAMINB12", "FOLATE", "FERRITIN", "TIBC", "IRON", "RETICCTPCT" in the last 72 hours. Sepsis Labs: No results for input(s): "PROCALCITON", "LATICACIDVEN" in the last 168 hours.  No results found for this or any previous visit (from the past 240 hour(s)).   Radiology Studies: DG Chest Port 1 View  Result Date: 12/11/2022 CLINICAL DATA:  Shortness of breath EXAM: PORTABLE CHEST 1 VIEW COMPARISON:  03/12/2022 x-ray FINDINGS: Underinflation. Borderline cardiopericardial silhouette with bronchovascular crowding. No pneumothorax, effusion or edema. There is some bandlike opacity in the left lung base. Atelectasis is favored over infiltrate recommend follow up. Overlapping cardiac leads. IMPRESSION: Poor inflation.  Bronchovascular crowding. Focal opacity left lung base. Atelectasis is favored over infiltrate but recommend follow-up Electronically Signed   By: Karen Kays M.D.   On: 12/11/2022 13:06    Scheduled Meds:  folic acid  1 mg Oral Daily   multivitamin with minerals  1 tablet Oral Daily   pantoprazole (PROTONIX) IV  40 mg Intravenous Q12H   polyethylene glycol powder  1 Container Oral Once   sodium chloride flush  3 mL Intravenous Q12H   thiamine  100 mg Oral Daily   Or   thiamine  100 mg Intravenous Daily   Continuous Infusions:  cefTRIAXone (ROCEPHIN)  IV Stopped (12/11/22 1529)   octreotide (SANDOSTATIN) 500 mcg in sodium chloride 0.9 % 250 mL (2 mcg/mL) infusion 50 mcg/hr (12/12/22 1105)     LOS: 1 day    Time spent: 50 mins    Willeen Niece, MD [facility]   If 7PM-7AM, please contact night-coverage

## 2022-12-12 NOTE — Progress Notes (Signed)
GI Inpatient Follow-up Note  Subjective:  Patient seen in follow-up for coffee-ground emesis and melena. No acute events overnight. He is s/p EGD yesterday afternoon with Dr. Norma Fredrickson which showed no evidence of active or stigmata of bleeding. He denies any further episodes of melena. He is without any new complaints.   Scheduled Inpatient Medications:   folic acid  1 mg Oral Daily   multivitamin with minerals  1 tablet Oral Daily   pantoprazole (PROTONIX) IV  40 mg Intravenous Q12H   sodium chloride flush  3 mL Intravenous Q12H   thiamine  100 mg Oral Daily   Or   thiamine  100 mg Intravenous Daily    Continuous Inpatient Infusions:    cefTRIAXone (ROCEPHIN)  IV Stopped (12/11/22 1529)   octreotide (SANDOSTATIN) 500 mcg in sodium chloride 0.9 % 250 mL (2 mcg/mL) infusion 50 mcg/hr (12/11/22 2338)    PRN Inpatient Medications:  HYDROmorphone (DILAUDID) injection, LORazepam **OR** LORazepam, ondansetron (ZOFRAN) IV  Review of Systems: Constitutional: Weight is stable.  Eyes: No changes in vision. ENT: No oral lesions, sore throat.  GI: see HPI.  Heme/Lymph: No easy bruising.  CV: No chest pain.  GU: No hematuria.  Integumentary: No rashes.  Neuro: No headaches.  Psych: No depression/anxiety.  Endocrine: No heat/cold intolerance.  Allergic/Immunologic: No urticaria.  Resp: No cough, SOB.  Musculoskeletal: No joint swelling.    Physical Examination: BP 114/66 (BP Location: Right Arm)   Pulse 71   Temp 97.8 F (36.6 C) (Oral)   Resp 16   SpO2 94%  Gen: NAD, alert and oriented x 4 HEENT: PEERLA, EOMI, Neck: supple, no JVD or thyromegaly Chest: CTA bilaterally, no wheezes, crackles, or other adventitious sounds CV: RRR, no m/g/c/r Abd: soft, NT, ND, +BS in all four quadrants; no HSM, guarding, ridigity, or rebound tenderness Ext: no edema, well perfused with 2+ pulses, Skin: no rash or lesions noted Lymph: no LAD  Data: Lab Results  Component Value Date   WBC  4.2 12/12/2022   HGB 12.9 (L) 12/12/2022   HCT 39.0 12/12/2022   MCV 96.5 12/12/2022   PLT 103 (L) 12/12/2022   Recent Labs  Lab 12/11/22 1155 12/12/22 0458  HGB 15.2 12.9*   Lab Results  Component Value Date   NA 137 12/12/2022   K 3.8 12/12/2022   CL 102 12/12/2022   CO2 26 12/12/2022   BUN 9 12/12/2022   CREATININE 0.61 12/12/2022   Lab Results  Component Value Date   ALT 32 12/12/2022   AST 37 12/12/2022   ALKPHOS 76 12/12/2022   BILITOT 1.5 (H) 12/12/2022   Recent Labs  Lab 12/11/22 1155 12/12/22 0458  APTT 34  --   INR 1.3* 1.3*   EGD 12/11/2022: Impression:             - Grade I esophageal varices. - Portal hypertensive gastropathy. - Hematin (altered blood/coffee-ground-like material) in the gastric body. - Normal examined duodenum. - The examination was otherwise normal. - No specimens collected.  Assessment/Plan:  27 y/o Hispanic male with a PMH of HTN, anxiety, depression, polysubstance abuse, and compensated hepatic cirrhosis 2/2 EtOH and hepatic sarcoidosis presented to the [facility] ED via EMS this morning for chief complaint of acute UGIB   Coffee-ground emesis/Melena/GIB - s/p EGD yesterday which was negative, H&H stable with no evidence of overt gastrointestinal bleeding.    Hepatic cirrhosis 2/2 EtOH + hepatic sarcoidosis - MELD-Na 11, Child's A (5)   Chest pain - EKG  with sinus tachycardia, suspect potential coronary vasospasm 2/2 cocaine abuse versus anxiety    Lower abdominal pain - improved    Portal hypertension - mild PHG, HSM, Gr I EV, thrombocytopenia    Polysubstance abuse - EtOH and cocaine    Liver lesion - LR-3, overdue for surveillance   Recommendations:   - Maintain 2 large bore IVs for access - Continue to monitor serial H&H. Transfuse for Hgb <7.0.  - Continue Ceftriaxone 1 gram q24h given hx of cirrhosis and presumed GIB - Continue Octreotide gtt for total 72-hours and Protonix IV BID - CIWA protocol as he is high-risk  for withdrawal/DTs - Monitor for signs of overt gastrointestinal bleeding - Clear liquid diet today. NPO midnight tonight.  - Will plan on colonoscopy tomorrow since EGD yesterday was negative  - Discussed importance of complete alcohol cessation with patient. If he continues to drink EtOH this will lead to hepatic decompensation and ultimately death.  - GI actively following along with you  - Further recommendations after procedure   I reviewed the risks (including bleeding, perforation, infection, anesthesia complications, cardiac/respiratory complications), benefits and alternatives of colonoscopy. Patient consents to proceed.   Please call with questions or concerns.   Jacob Moores, PA-C [facility] [phone]

## 2022-12-12 NOTE — ED Notes (Signed)
Pt resting comfortably with eyes closed at this time. Respirations even and unlabored.

## 2022-12-13 ENCOUNTER — Inpatient Hospital Stay: Payer: BLUE CROSS/BLUE SHIELD | Admitting: Anesthesiology

## 2022-12-13 ENCOUNTER — Encounter: Payer: Self-pay | Admitting: Internal Medicine

## 2022-12-13 ENCOUNTER — Encounter
Admission: EM | Disposition: A | Discharge status: Home / Self Care | Payer: Self-pay | Source: Home / Self Care | Attending: Family Medicine

## 2022-12-13 DIAGNOSIS — K922 Gastrointestinal hemorrhage, unspecified: Secondary | ICD-10-CM | POA: Diagnosis not present

## 2022-12-13 HISTORY — PX: COLONOSCOPY: SHX5424

## 2022-12-13 LAB — COMPREHENSIVE METABOLIC PANEL
ALT: 35 U/L (ref 0–44)
AST: 43 U/L — ABNORMAL HIGH (ref 15–41)
Albumin: 3.6 g/dL (ref 3.5–5.0)
Alkaline Phosphatase: 68 U/L (ref 38–126)
Anion gap: 6 (ref 5–15)
BUN: 9 mg/dL (ref 6–20)
CO2: 27 mmol/L (ref 22–32)
Calcium: 8.8 mg/dL — ABNORMAL LOW (ref 8.9–10.3)
Chloride: 103 mmol/L (ref 98–111)
Creatinine, Ser: 0.62 mg/dL (ref 0.61–1.24)
GFR, Estimated: >60 mL/min (ref >60)
Glucose, Bld: 121 mg/dL — ABNORMAL HIGH (ref 70–99)
Potassium: 3.7 mmol/L (ref 3.5–5.1)
Sodium: 136 mmol/L (ref 135–145)
Total Bilirubin: 1.5 mg/dL — ABNORMAL HIGH (ref 0.3–1.2)
Total Protein: 7.5 g/dL (ref 6.5–8.1)

## 2022-12-13 LAB — CBC
HCT: 39.8 % (ref 39.0–52.0)
Hemoglobin: 13.5 g/dL (ref 13.0–17.0)
MCH: 31.9 pg (ref 26.0–34.0)
MCHC: 33.9 g/dL (ref 30.0–36.0)
MCV: 94.1 fL (ref 80.0–100.0)
Platelets: 96 10*3/uL — ABNORMAL LOW (ref 150–400)
RBC: 4.23 MIL/uL (ref 4.22–5.81)
RDW: 15.7 % — ABNORMAL HIGH (ref 11.5–15.5)
WBC: 5.6 10*3/uL (ref 4.0–10.5)
nRBC: 0 % (ref 0.0–0.2)

## 2022-12-13 LAB — PHOSPHORUS: Phosphorus: 2.8 mg/dL (ref 2.5–4.6)

## 2022-12-13 LAB — MAGNESIUM: Magnesium: 2 mg/dL (ref 1.7–2.4)

## 2022-12-13 SURGERY — COLONOSCOPY
Anesthesia: General

## 2022-12-13 MED ORDER — HYDROMORPHONE HCL 1 MG/ML IJ SOLN
0.5000 mg | Freq: Once | INTRAMUSCULAR | Status: AC
  Administered 2022-12-13: 0.5 mg via INTRAVENOUS
  Filled 2022-12-13: qty 1

## 2022-12-13 MED ORDER — SODIUM CHLORIDE 0.9 % IV SOLN
INTRAVENOUS | Status: DC
  Administered 2022-12-13: 1000 mL via INTRAVENOUS

## 2022-12-13 MED ORDER — PROPOFOL 500 MG/50ML IV EMUL
INTRAVENOUS | Status: DC | PRN
  Administered 2022-12-13: 130 ug/kg/min via INTRAVENOUS

## 2022-12-13 MED ORDER — DEXMEDETOMIDINE HCL IN NACL 200 MCG/50ML IV SOLN
INTRAVENOUS | Status: DC | PRN
  Administered 2022-12-13: 8 ug via INTRAVENOUS

## 2022-12-13 MED ORDER — HYDROMORPHONE HCL 1 MG/ML IJ SOLN
1.0000 mg | INTRAMUSCULAR | Status: DC | PRN
  Administered 2022-12-13 – 2022-12-14 (×3): 1 mg via INTRAVENOUS
  Filled 2022-12-13 (×3): qty 1

## 2022-12-13 MED ORDER — PROPOFOL 10 MG/ML IV BOLUS
INTRAVENOUS | Status: DC | PRN
  Administered 2022-12-13: 100 mg via INTRAVENOUS

## 2022-12-13 NOTE — Op Note (Signed)
[facility] Patient Name: William Beasley Procedure Date: 12/13/2022 2:31 PM MRN: [id number] Account #: [id number] Date of Birth: 20-Nov-1995 Admit Type: Outpatient Age: 27 Room: [facility] ENDO ROOM 3 Gender: Male Note Status: Finalized Instrument Name: Prentice Docker [id number] Procedure:             Colonoscopy Indications:           Melena Providers:             Boykin Nearing. Norma Fredrickson MD, MD Referring MD:          No Local Md, MD (Referring MD) Medicines:             Propofol per Anesthesia Complications:         No immediate complications. Procedure:             Pre-Anesthesia Assessment:                        - The risks and benefits of the procedure and the                         sedation options and risks were discussed with the                         patient. All questions were answered and informed                         consent was obtained.                        - Patient identification and proposed procedure were                         verified prior to the procedure by the nurse. The                         procedure was verified in the procedure room.                        - ASA Grade Assessment: III - A patient with severe                         systemic disease.                        - After reviewing the risks and benefits, the patient                         was deemed in satisfactory condition to undergo the                         procedure.                        After obtaining informed consent, the colonoscope was                         passed under direct vision. Throughout the procedure,                         the patient's blood pressure,  pulse, and oxygen                         saturations were monitored continuously. The                         Colonoscope was introduced through the anus and                         advanced to the the cecum, identified by appendiceal                         orifice and ileocecal valve. The  colonoscopy was                         performed without difficulty. The patient tolerated                         the procedure well. The quality of the bowel                         preparation was adequate. The ileocecal valve,                         appendiceal orifice, and rectum were photographed. Findings:      The perianal and digital rectal examinations were normal. Pertinent       negatives include normal sphincter tone and no palpable rectal lesions.      The entire examined colon appeared normal on direct and retroflexion       views. Impression:            - The entire examined colon is normal on direct and                         retroflexion views.                        - No specimens collected. Recommendation:        - Return patient to hospital ward for possible                         discharge same day.                        - Advance diet as tolerated.                        - Alcohol and drug cessation. Procedure Code(s):     --- Professional ---                        [phone], Colonoscopy, flexible; diagnostic, including                         collection of specimen(s) by brushing or washing, when                         performed (separate procedure) Diagnosis Code(s):     --- Professional ---  K92.1, Melena (includes Hematochezia) CPT copyright 2022 American Medical Association. All rights reserved. The codes documented in this report are preliminary and upon coder review may  be revised to meet current compliance requirements. Stanton Kidney MD, MD 12/13/2022 3:00:04 PM This report has been signed electronically. Number of Addenda: 0 Note Initiated On: 12/13/2022 2:31 PM Scope Withdrawal Time: 0 hours 5 minutes 58 seconds  Total Procedure Duration: 0 hours 10 minutes 14 seconds  Estimated Blood Loss:  Estimated blood loss: none.      [facility]

## 2022-12-13 NOTE — Anesthesia Preprocedure Evaluation (Signed)
Anesthesia Evaluation  Patient identified by MRN, date of birth, ID band Patient awake    Reviewed: Allergy & Precautions, NPO status , Patient's Chart, lab work & pertinent test results  Airway Mallampati: II  TM Distance: >3 FB Neck ROM: Full    Dental  (+) Teeth Intact   Pulmonary neg pulmonary ROS   Pulmonary exam normal breath sounds clear to auscultation       Cardiovascular Exercise Tolerance: Good negative cardio ROS Normal cardiovascular exam Rhythm:Regular Rate:Normal     Neuro/Psych    Depression    negative neurological ROS  negative psych ROS   GI/Hepatic negative GI ROS, Neg liver ROS,,,(+) Cirrhosis   Esophageal Varices    , Hepatitis -  Endo/Other  negative endocrine ROS    Renal/GU negative Renal ROS  negative genitourinary   Musculoskeletal   Abdominal  (+) + obese  Peds negative pediatric ROS (+)  Hematology negative hematology ROS (+)   Anesthesia Other Findings Past Medical History: No date: Cirrhosis of liver (HCC) 11/20/2022: Hematemesis  Past Surgical History: 12/11/2022: ESOPHAGOGASTRODUODENOSCOPY (EGD) WITH PROPOFOL; N/A     Comment:  Procedure: ESOPHAGOGASTRODUODENOSCOPY (EGD) WITH               PROPOFOL;  Surgeon: Toledo, Boykin Nearing, MD;  Location:               [facility] ENDOSCOPY;  Service: Gastroenterology;  Laterality:               N/A;  BMI    Body Mass Index: 34.27 kg/m      Reproductive/Obstetrics negative OB ROS                             Anesthesia Physical Anesthesia Plan  ASA: 3  Anesthesia Plan: General   Post-op Pain Management:    Induction: Intravenous  PONV Risk Score and Plan: Propofol infusion and TIVA  Airway Management Planned: Natural Airway  Additional Equipment:   Intra-op Plan:   Post-operative Plan:   Informed Consent: I have reviewed the patients History and Physical, chart, labs and discussed the procedure  including the risks, benefits and alternatives for the proposed anesthesia with the patient or authorized representative who has indicated his/her understanding and acceptance.     Dental Advisory Given  Plan Discussed with: CRNA and Surgeon  Anesthesia Plan Comments:        Anesthesia Quick Evaluation

## 2022-12-13 NOTE — Progress Notes (Signed)
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  Patient reports he recently began drinking again after he lost custody of his 2 children due to conflict between him and his partner.  He drank approximately 30 beers yesterday with the last drink was around 6 AM.  He also used cocaine.  He did report nausea and large-volume emesis that was both bright red and with coffee-ground's.  Patient also report large-volume melanotic stools.  He also reports having epigastric and upper upper quadrant abdominal pain radiating towards the back.  Workup in the ED reveals tachycardia, hemoglobin was 15.2 on arrival.  Patient was admitted and was started on Protonix and octreotide.  GI is consulted.  Patient underwent EGD found to have grade 1 esophageal varices which was banded.  Patient is scheduled to have colonoscopy today.  Assessment & Plan:   Principal Problem:   Acute upper GI bleed Active Problems:   Alcohol dependence with withdrawal (HCC)   Liver cirrhosis (HCC)   Polysubstance abuse (HCC)  Acute Upper GI bleed: Patient presented with several episodes of large-volume hematemesis and melena. He has history of cirrhosis with secondary varices.   Hemodynamically stable at this time with stable hemoglobin. GI is consulted.  Patient underwent EGD which showed grade 1 esophageal varices, portal hypertension. Continue octreotide infusion for 72 hours Continue Protonix 40 mg IV twice daily Continue ceftriaxone 1 g given history of cirrhosis and presumed GIB. H/ H remains stable. Started on clear liquid diet. He is scheduled for colonoscopy today.    Alcohol dependence with  withdrawal: Patient has history of alcohol use disorder which is severe and active. Ethanol level 100.  However patient states his last drink was at 6 AM. Continue CIWA protocol every 4 hours with as needed Ativan. TOC consultation for resources Continue Daily thiamine, folic acid and multivitamin.   Liver cirrhosis (HCC): Multifactorial in the setting of alcohol use disorder and hepatic sarcoidosis (biopsy proven).   Not currently a transplant candidate given ongoing alcohol use.   Polysubstance abuse (HCC) Predominantly alcohol and cocaine use.  Interested in resources for cessation. TOC consultation.  DVT prophylaxis:  SCDs Code Status: Full code Family Communication:No family at bed side. Disposition Plan:    Status is: Inpatient Remains inpatient appropriate because:   Admitted for black-colored stools, GI is consulted,  Patient underwent EGD,  found to have varices which was banded.  He is scheduled for colonoscopy today.   Consultants:  Gastroenterology  Procedures: EGD, Colonoscopy  Antimicrobials:  Anti-infectives (From admission, onward)    Start     Dose/Rate Route Frequency Ordered Stop   12/11/22 1400  cefTRIAXone (ROCEPHIN) 1 g in sodium chloride 0.9 % 100 mL IVPB        1 g 200 mL/hr over 30 Minutes Intravenous Every 24 hours 12/11/22 1327        Subjective: Patient was seen and examined at bedside.  Overnight events noted. Patient was sitting comfortably.  S/p EGD with esophageal varices yesterday. Patient continues to drink EtOH.  He is scheduled to have colonoscopy today.  Objective: Vitals:   12/13/22 0008 12/13/22 0200 12/13/22 0533 12/13/22 1320  BP: 104/73 112/66 103/66 133/74  Pulse: 75  69 67 64  Resp: 18 19 18 20   Temp: 97.6 F (36.4 C)  (!) 97.5 F (36.4 C) 98.1 F (36.7 C)  TempSrc: Oral  Oral Oral  SpO2: 97% 97% 97% 100%  Weight:    105.3 kg  Height:    5' 9.02" (1.753 m)    Intake/Output Summary (Last 24 hours) at 12/13/2022  1334 Last data filed at 12/13/2022 0300 Gross per 24 hour  Intake 419.17 ml  Output --  Net 419.17 ml   Filed Weights   12/13/22 1320  Weight: 105.3 kg    Examination:  General exam: Appears calm and comfortable, deconditioned, not in any distress. Respiratory system: Clear to auscultation. Respiratory effort normal.  RR 15 Cardiovascular system: S1 & S2 heard, RRR. No JVD, murmurs, rubs, gallops or clicks. No pedal edema. Gastrointestinal system: Abdomen is soft, non tender, non distended, BS+ Central nervous system: Alert and oriented x 3. No focal neurological deficits. Extremities: No edema, no cyanosis, no clubbing Skin: No rashes, lesions or ulcers Psychiatry: Judgement and insight appear normal. Mood & affect appropriate.     Data Reviewed: I have personally reviewed following labs and imaging studies  CBC: Recent Labs  Lab 12/11/22 1155 12/12/22 0458 12/13/22 0648  WBC 8.0 4.2 5.6  HGB 15.2 12.9* 13.5  HCT 45.1 39.0 39.8  MCV 93.4 96.5 94.1  PLT 162 103* 96*   Basic Metabolic Panel: Recent Labs  Lab 12/11/22 1155 12/12/22 0458 12/13/22 0648  NA 139 137 136  K 3.6 3.8 3.7  CL 105 102 103  CO2 22 26 27   GLUCOSE 105* 140* 121*  BUN 15 9 9   CREATININE 0.53* 0.61 0.62  CALCIUM 8.9 8.6* 8.8*  MG  --   --  2.0  PHOS  --   --  2.8   GFR: Estimated Creatinine Clearance: 164.3 mL/min (by C-G formula based on SCr of 0.62 mg/dL). Liver Function Tests: Recent Labs  Lab 12/11/22 1155 12/12/22 0458 12/13/22 0648  AST 48* 37 43*  ALT 41 32 35  ALKPHOS 92 76 68  BILITOT 1.4* 1.5* 1.5*  PROT 8.4* 7.3 7.5  ALBUMIN 4.0 3.5 3.6   No results for input(s): "LIPASE", "AMYLASE" in the last 168 hours. No results for input(s): "AMMONIA" in the last 168 hours. Coagulation Profile: Recent Labs  Lab 12/11/22 1155 12/12/22 0458  INR 1.3* 1.3*   Cardiac Enzymes: No results for input(s): "CKTOTAL", "CKMB", "CKMBINDEX", "TROPONINI" in the last 168 hours. BNP  (last 3 results) No results for input(s): "PROBNP" in the last 8760 hours. HbA1C: No results for input(s): "HGBA1C" in the last 72 hours. CBG: No results for input(s): "GLUCAP" in the last 168 hours. Lipid Profile: No results for input(s): "CHOL", "HDL", "LDLCALC", "TRIG", "CHOLHDL", "LDLDIRECT" in the last 72 hours. Thyroid Function Tests: No results for input(s): "TSH", "T4TOTAL", "FREET4", "T3FREE", "THYROIDAB" in the last 72 hours. Anemia Panel: No results for input(s): "VITAMINB12", "FOLATE", "FERRITIN", "TIBC", "IRON", "RETICCTPCT" in the last 72 hours. Sepsis Labs: No results for input(s): "PROCALCITON", "LATICACIDVEN" in the last 168 hours.  No results found for this or any previous visit (from the past 240 hour(s)).   Radiology Studies: No results found.  Scheduled Meds:  escitalopram  10 mg Oral Daily   folic acid  1 mg Oral Daily   multivitamin with minerals  1 tablet Oral Daily   pantoprazole (PROTONIX) IV  40 mg Intravenous Q12H   sodium chloride flush  3 mL Intravenous Q12H  thiamine  100 mg Oral Daily   Or   thiamine  100 mg Intravenous Daily   Continuous Infusions:  cefTRIAXone (ROCEPHIN)  IV Stopped (12/12/22 1428)   octreotide (SANDOSTATIN) 500 mcg in sodium chloride 0.9 % 250 mL (2 mcg/mL) infusion 50 mcg/hr (12/13/22 1000)     LOS: 2 days    Time spent: 35 mins    Willeen Niece, MD [facility]   If 7PM-7AM, please contact night-coverage

## 2022-12-13 NOTE — Transfer of Care (Signed)
Immediate Anesthesia Transfer of Care Note  Patient: William Beasley  Procedure(s) Performed: COLONOSCOPY  Patient Location: PACU  Anesthesia Type:General  Level of Consciousness: drowsy and patient cooperative  Airway & Oxygen Therapy: Patient Spontanous Breathing  Post-op Assessment: Report given to RN and Post -op Vital signs reviewed and stable  Post vital signs: Reviewed and stable  Last Vitals:  Vitals Value Taken Time  BP 96/73 12/13/22 1503  Temp    Pulse 60 12/13/22 1504  Resp 15 12/13/22 1504  SpO2 97 % 12/13/22 1504  Vitals shown include unvalidated device data.  Last Pain:  Vitals:   12/13/22 1427  TempSrc: Temporal  PainSc: 0-No pain      Patients Stated Pain Goal: 0 (12/11/22 2012)  Complications: No notable events documented.

## 2022-12-13 NOTE — Interval H&P Note (Signed)
History and Physical Interval Note:  12/13/2022 2:15 PM  William Beasley  has presented today for surgery, with the diagnosis of Melena/Coffee-ground emesis/GIB.  The various methods of treatment have been discussed with the patient and family. After consideration of risks, benefits and other options for treatment, the patient has consented to  Procedure(s): COLONOSCOPY (N/A) as a surgical intervention.  The patient's history has been reviewed, patient examined, no change in status, stable for surgery.  I have reviewed the patient's chart and labs.  Questions were answered to the patient's satisfaction.     [place], [place]

## 2022-12-14 DIAGNOSIS — K922 Gastrointestinal hemorrhage, unspecified: Secondary | ICD-10-CM | POA: Diagnosis not present

## 2022-12-14 MED ORDER — FOLIC ACID 1 MG PO TABS: 1.0000 mg | ORAL_TABLET | Freq: Every day | ORAL | 1 refills | Status: AC

## 2022-12-14 MED ORDER — VITAMIN B-1 100 MG PO TABS: 100.0000 mg | ORAL_TABLET | Freq: Every day | ORAL | 1 refills | Status: AC

## 2022-12-14 NOTE — Discharge Instructions (Signed)
Advised to follow-up with primary care physician in 1 week. Patient admitted for black-colored stools,  underwent EGD and colonoscopy. EGD showed esophageal varices which was banded. Colonoscopy was unremarkable.

## 2022-12-14 NOTE — Discharge Summary (Signed)
Physician Discharge Summary  ROSSIE SICOTTE BJY:[id number] DOB: [birth year] DOA: 12/11/2022  PCP: [facility]  Admit date: 12/11/2022  Discharge date: 12/14/2022  Admitted From: Home  Disposition:  Home.  Recommendations for Outpatient Follow-up:  Follow up with PCP in 1-2 weeks. Please obtain BMP/CBC in one week. Patient admitted for black-colored stool underwent EGD and colonoscopy. EGD showed esophageal varices which were banded. Colonoscopy was unremarkable.  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  Patient reports he recently began drinking again after he lost custody of his 2 children due to conflict between him and his partner.  He drank approximately 30 beers yesterday with the last drink was around 6 AM.  He also used cocaine.  He did report nausea and large-volume emesis that was both bright red and with coffee-ground's.  Patient also reports large-volume melanotic stools.  He also reports having epigastric and upper upper quadrant abdominal pain radiating towards the back.  Workup in the ED reveals tachycardia, hemoglobin was 15.2 on arrival.  Patient was admitted and was started on Protonix and octreotide.  GI is consulted.  Patient underwent EGD found to have grade 1 esophageal varices which was banded.  Patient underwent colonoscopy which was completely unremarkable.  GI recommended patient can be discharged.  Patient feels better and wanted to be discharged.  Patient ambulated in the hallway without any dizziness or symptoms.  Discharge Diagnoses:  Principal Problem:   Acute upper GI bleed Active  Problems:   Alcohol dependence with withdrawal (HCC)   Liver cirrhosis (HCC)   Polysubstance abuse (HCC)  Acute Upper GI bleed: Patient presented with several episodes of large-volume hematemesis and melena. He has history of cirrhosis with secondary varices.   Hemodynamically stable at this time with stable hemoglobin. GI is consulted.  Patient underwent EGD which showed grade 1 esophageal varices, portal hypertension. Continue octreotide infusion for 72 hours Continue Protonix 40 mg IV twice daily Continue ceftriaxone 1 g given history of cirrhosis and presumed GIB. H/ H remains stable. Started on clear liquid diet. He underwent colonoscopy which was unremarkable. H&H remains stable.   Alcohol dependence with withdrawal: Patient has history of alcohol use disorder which is severe and active. Ethanol level 100.  However patient states his last drink was at 6 AM. Continue CIWA protocol every 4 hours with as needed Ativan. TOC consultation for resources. Continue Daily thiamine, folic acid and multivitamin.   Liver cirrhosis (HCC): Multifactorial in the setting of alcohol use disorder and hepatic sarcoidosis (biopsy proven).   Not currently a transplant candidate given ongoing alcohol use.   Polysubstance abuse (HCC) Predominantly alcohol and cocaine use.  Interested in resources for cessation. TOC consultation.  Discharge Instructions  Discharge Instructions     Call MD for:  difficulty breathing, headache or visual disturbances   Complete by: As directed    Call MD for:  persistant dizziness or light-headedness   Complete by: As directed    Call MD for:  persistant nausea and vomiting   Complete by: As directed    Diet - low sodium heart healthy   Complete by: As directed    Diet Carb Modified   Complete by: As directed  Discharge instructions   Complete by: As directed    Advised to follow-up with primary care physician in 1 week. Patient admitted for  black-colored stool underwent EGD and colonoscopy. EGD shows esophageal varices which was banded. Colonoscopy was unremarkable.   Increase activity slowly   Complete by: As directed       Allergies as of 12/14/2022   No Known Allergies      Medication List     STOP taking these medications    escitalopram 10 MG tablet Commonly known as: LEXAPRO   thiamine 100 MG tablet Commonly known as: VITAMIN B1   traZODone 50 MG tablet Commonly known as: DESYREL       TAKE these medications    carvedilol 6.25 MG tablet Commonly known as: COREG Take 1 tablet (6.25 mg total) by mouth 2 (two) times daily.   Cholecalciferol 25 MCG (1000 UT) capsule Take 1,000 Units by mouth daily.   folic acid 1 MG tablet Commonly known as: FOLVITE Take 1 tablet (1 mg total) by mouth daily. Start taking on: Dec 15, 2022   hydrOXYzine 25 MG tablet Commonly known as: ATARAX Take 1 tablet (25 mg total) by mouth 3 (three) times daily as needed for anxiety.   ondansetron 8 MG disintegrating tablet Commonly known as: ZOFRAN-ODT Take 1 tablet (8 mg total) by mouth every 8 (eight) hours as needed for nausea or vomiting.   pantoprazole 40 MG tablet Commonly known as: Protonix Take 1 tablet (40 mg total) by mouth 2 (two) times daily.   thiamine 100 MG tablet Commonly known as: Vitamin B-1 Take 1 tablet (100 mg total) by mouth daily. Start taking on: Dec 15, 2022        Follow-up Information     [place] Follow up in 1 week(s).   Contact information: [address] [place] [address] [phone]                No Known Allergies  Consultations: Gastroenterology   Procedures/Studies: DG Chest Port 1 View  Result Date: 12/11/2022 CLINICAL DATA:  Shortness of breath EXAM: PORTABLE CHEST 1 VIEW COMPARISON:  03/12/2022 x-ray FINDINGS: Underinflation. Borderline cardiopericardial silhouette with bronchovascular crowding. No pneumothorax, effusion or edema.  There is some bandlike opacity in the left lung base. Atelectasis is favored over infiltrate recommend follow up. Overlapping cardiac leads. IMPRESSION: Poor inflation.  Bronchovascular crowding. Focal opacity left lung base. Atelectasis is favored over infiltrate but recommend follow-up Electronically Signed   By: Karen Kays M.D.   On: 12/11/2022 13:06   EGD, colonoscopy   Subjective: Patient was seen and examined at bedside.  Overnight events noted.   Patient reports doing much better and wants to be discharged. H&H remains stable.  Discharge Exam: Vitals:   12/14/22 0320 12/14/22 0858  BP: 129/88 122/89  Pulse: 72 68  Resp: 18 16  Temp: 97.6 F (36.4 C) 98.1 F (36.7 C)  SpO2: 99% 100%   Vitals:   12/13/22 1945 12/13/22 2329 12/14/22 0320 12/14/22 0858  BP: 128/75 113/67 129/88 122/89  Pulse: 73 (!) 58 72 68  Resp: 18 18 18 16   Temp: 97.8 F (36.6 C) 97.9 F (36.6 C) 97.6 F (36.4 C) 98.1 F (36.7 C)  TempSrc:      SpO2: 98% 99% 99% 100%  Weight:      Height:        General: Pt is alert, awake, not in acute distress Cardiovascular: RRR, S1/S2 +, no rubs,  no gallops Respiratory: CTA bilaterally, no wheezing, no rhonchi Abdominal: Soft, NT, ND, bowel sounds + Extremities: no edema, no cyanosis    The results of significant diagnostics from this hospitalization (including imaging, microbiology, ancillary and laboratory) are listed below for reference.     Microbiology: No results found for this or any previous visit (from the past 240 hour(s)).   Labs: BNP (last 3 results) No results for input(s): "BNP" in the last 8760 hours. Basic Metabolic Panel: Recent Labs  Lab 12/11/22 1155 12/12/22 0458 12/13/22 0648  NA 139 137 136  K 3.6 3.8 3.7  CL 105 102 103  CO2 22 26 27   GLUCOSE 105* 140* 121*  BUN 15 9 9   CREATININE 0.53* 0.61 0.62  CALCIUM 8.9 8.6* 8.8*  MG  --   --  2.0  PHOS  --   --  2.8   Liver Function Tests: Recent Labs  Lab  12/11/22 1155 12/12/22 0458 12/13/22 0648  AST 48* 37 43*  ALT 41 32 35  ALKPHOS 92 76 68  BILITOT 1.4* 1.5* 1.5*  PROT 8.4* 7.3 7.5  ALBUMIN 4.0 3.5 3.6   No results for input(s): "LIPASE", "AMYLASE" in the last 168 hours. No results for input(s): "AMMONIA" in the last 168 hours. CBC: Recent Labs  Lab 12/11/22 1155 12/12/22 0458 12/13/22 0648  WBC 8.0 4.2 5.6  HGB 15.2 12.9* 13.5  HCT 45.1 39.0 39.8  MCV 93.4 96.5 94.1  PLT 162 103* 96*   Cardiac Enzymes: No results for input(s): "CKTOTAL", "CKMB", "CKMBINDEX", "TROPONINI" in the last 168 hours. BNP: Invalid input(s): "POCBNP" CBG: No results for input(s): "GLUCAP" in the last 168 hours. D-Dimer No results for input(s): "DDIMER" in the last 72 hours. Hgb A1c No results for input(s): "HGBA1C" in the last 72 hours. Lipid Profile No results for input(s): "CHOL", "HDL", "LDLCALC", "TRIG", "CHOLHDL", "LDLDIRECT" in the last 72 hours. Thyroid function studies No results for input(s): "TSH", "T4TOTAL", "T3FREE", "THYROIDAB" in the last 72 hours.  Invalid input(s): "FREET3" Anemia work up No results for input(s): "VITAMINB12", "FOLATE", "FERRITIN", "TIBC", "IRON", "RETICCTPCT" in the last 72 hours. Urinalysis    Component Value Date/Time   COLORURINE YELLOW (A) 11/20/2022 2145   APPEARANCEUR CLEAR (A) 11/20/2022 2145   LABSPEC 1.017 11/20/2022 2145   PHURINE 7.0 11/20/2022 2145   GLUCOSEU NEGATIVE 11/20/2022 2145   HGBUR NEGATIVE 11/20/2022 2145   BILIRUBINUR NEGATIVE 11/20/2022 2145   KETONESUR 5 (A) 11/20/2022 2145   PROTEINUR NEGATIVE 11/20/2022 2145   NITRITE NEGATIVE 11/20/2022 2145   LEUKOCYTESUR NEGATIVE 11/20/2022 2145   Sepsis Labs Recent Labs  Lab 12/11/22 1155 12/12/22 0458 12/13/22 0648  WBC 8.0 4.2 5.6   Microbiology No results found for this or any previous visit (from the past 240 hour(s)).   Time coordinating discharge: Over 30 minutes  SIGNED:   Willeen Niece, MD  [facility] 12/14/2022, 2:18 PM Pager   If 7PM-7AM, please contact night-coverage

## 2022-12-16 ENCOUNTER — Encounter: Payer: Self-pay | Admitting: Internal Medicine

## 2022-12-26 NOTE — Anesthesia Postprocedure Evaluation (Signed)
Anesthesia Post Note  Patient: William Beasley  Procedure(s) Performed: COLONOSCOPY  Patient location during evaluation: PACU Anesthesia Type: General Level of consciousness: awake Pain management: pain level controlled Vital Signs Assessment: post-procedure vital signs reviewed and stable Respiratory status: spontaneous breathing and respiratory function stable Cardiovascular status: blood pressure returned to baseline Anesthetic complications: no   No notable events documented.   Last Vitals:  Vitals:   12/14/22 0320 12/14/22 0858  BP: 129/88 122/89  Pulse: 72 68  Resp: 18 16  Temp: 36.4 C 36.7 C  SpO2: 99% 100%    Last Pain:  Vitals:   12/14/22 0935  TempSrc:   PainSc: 2                  [name],[name]

## 2024-04-13 IMAGING — CT CT ABD-PELV W/ CM
2 of 4 series · 15 of 46 positions shown, 17 images · IV contrast (APPLIED)
Comparison: Ultrasound abdomen 01/18/2022

CLINICAL DATA: Abdominal pain, acute, nonlocalized. Patient c/o
abdominal pain with N/V since this AM. Reports hx cirrhosis of
liver. Patient reports drinking 6 non alcoholic beers last night

EXAM:
CT ABDOMEN AND PELVIS WITH CONTRAST
TECHNIQUE: Multidetector CT imaging of the abdomen and pelvis was performed
using the standard protocol following bolus administration of
intravenous contrast.

[Series 2: routine abd/pel with · axial · 0.86mm/px · z∈[-884,-399]mm · 12 of 107 slices shown, 14 images]
[im 5/107  soft-tissue]
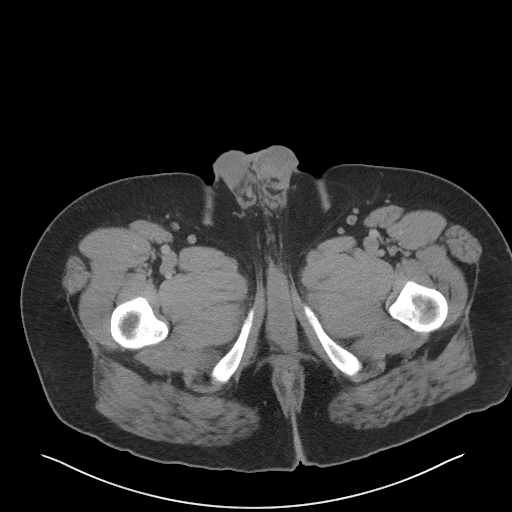
[im 5/107  bone]
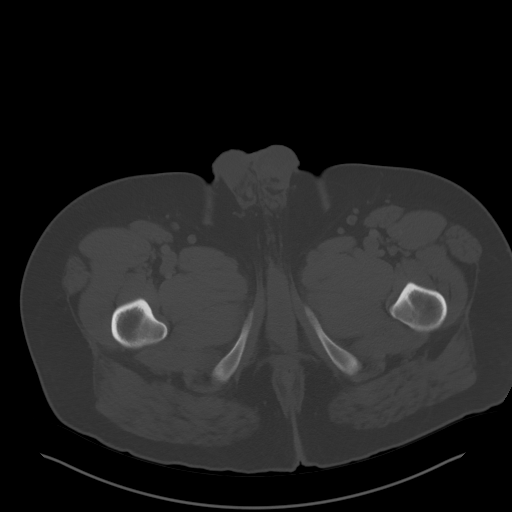
[im 15/107  soft-tissue]
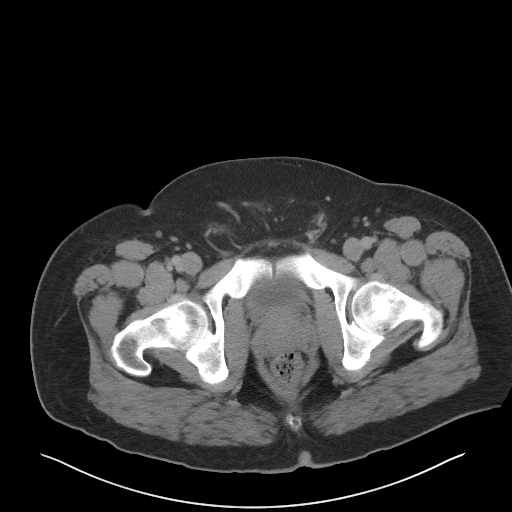
[im 25/107  soft-tissue]
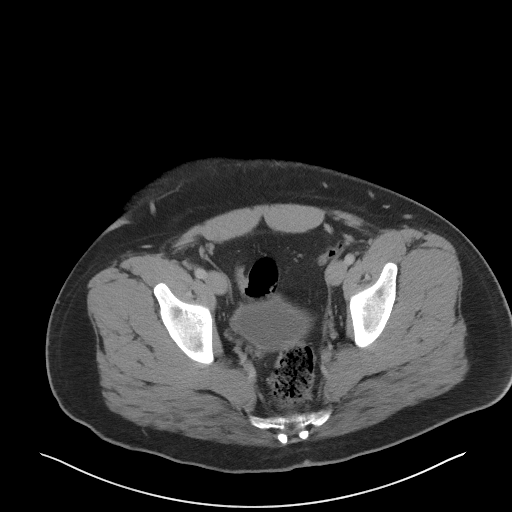
[im 34/107  soft-tissue]
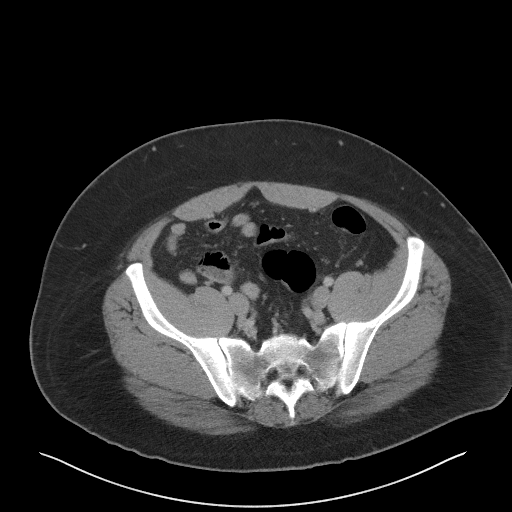
[im 39/107  soft-tissue]
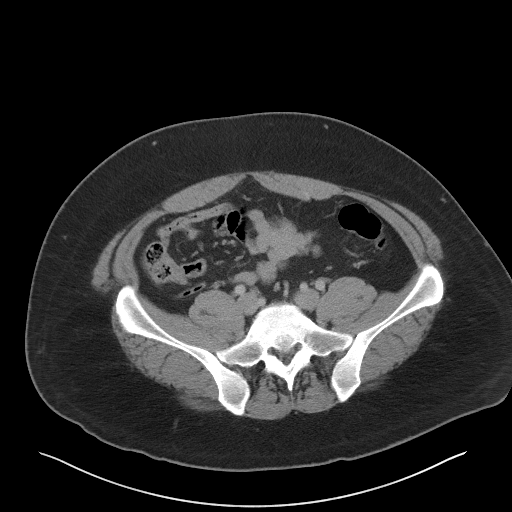
[im 49/107  soft-tissue]
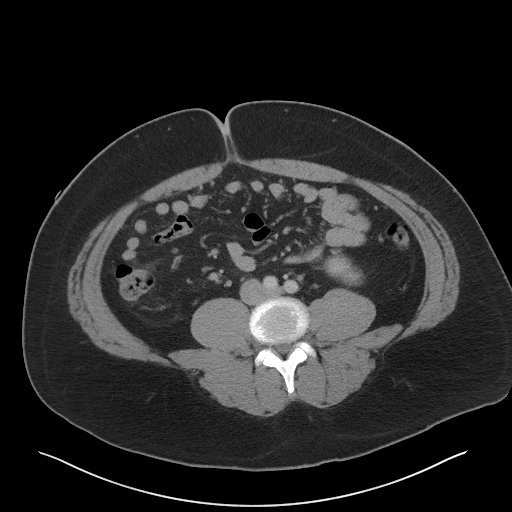
[im 58/107  soft-tissue]
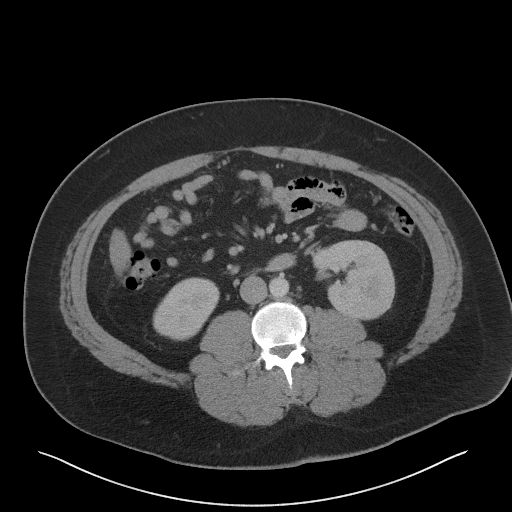
[im 68/107  soft-tissue]
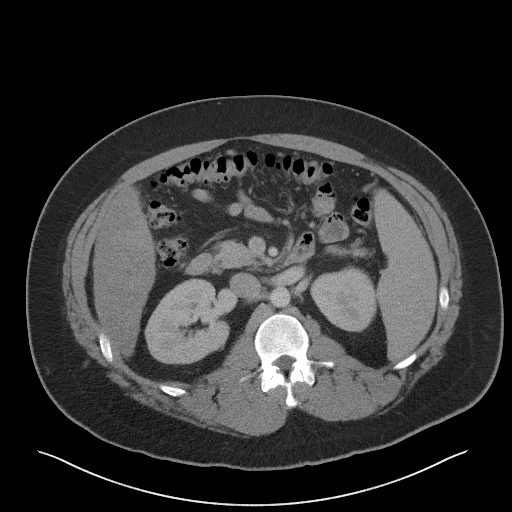
[im 73/107  soft-tissue]
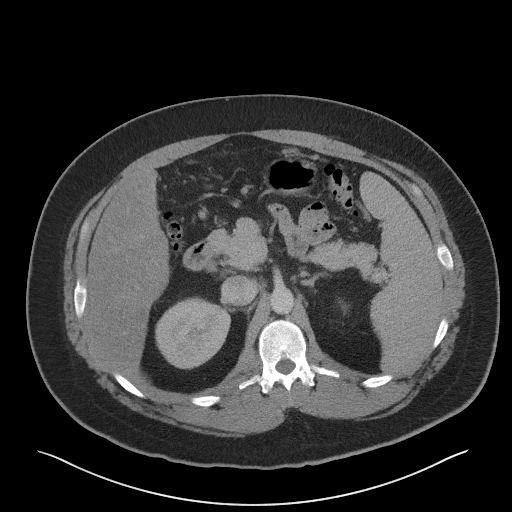
[im 73/107  bone]
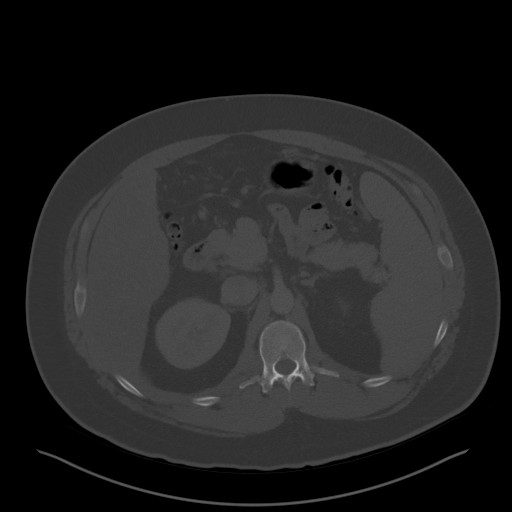
[im 82/107  soft-tissue]
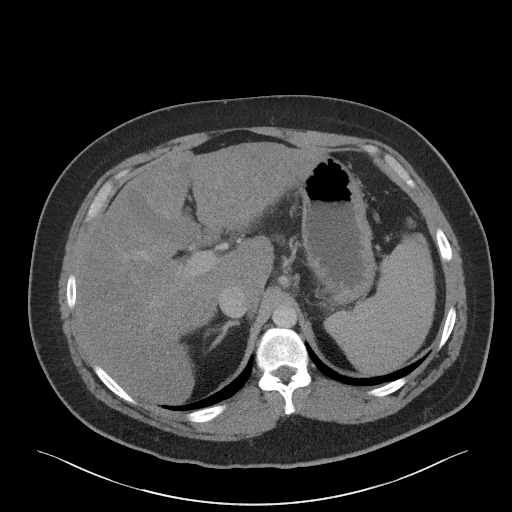
[im 92/107  soft-tissue]
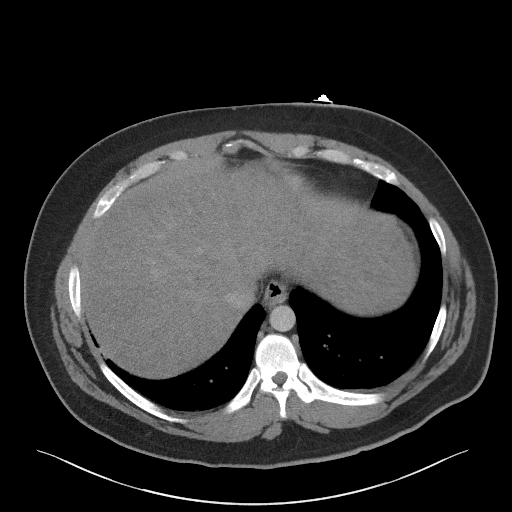
[im 102/107  soft-tissue]
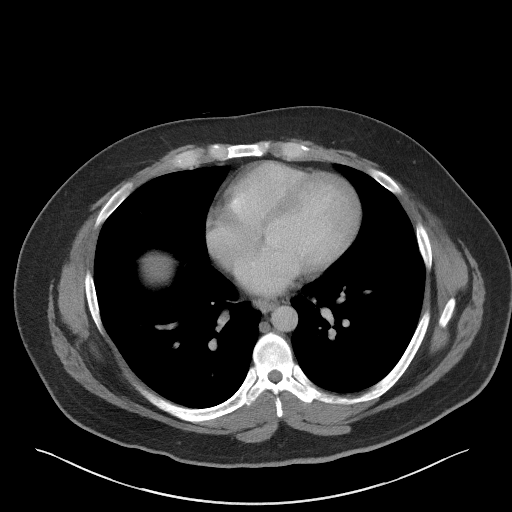

[Series 5: coronal st · coronal · 0.84mm/px · 3 of 105 slices shown]
[im 35/105  soft-tissue]
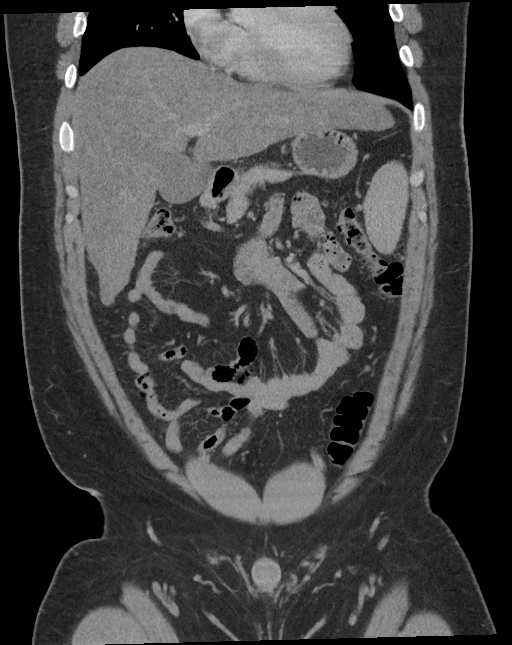
[im 47/105  soft-tissue]
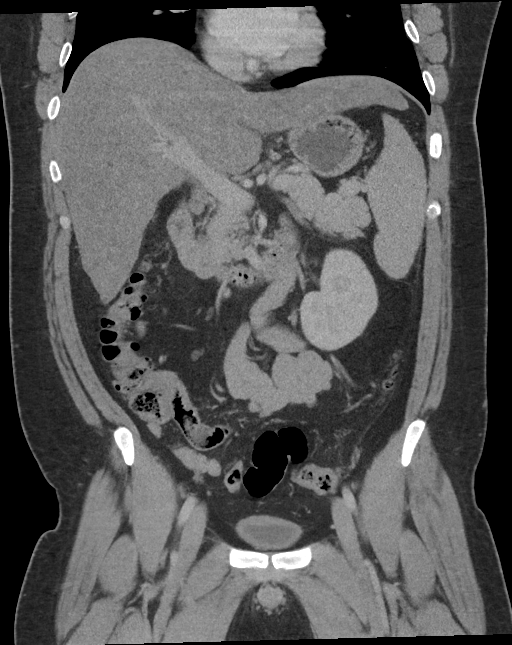
[im 58/105  soft-tissue]
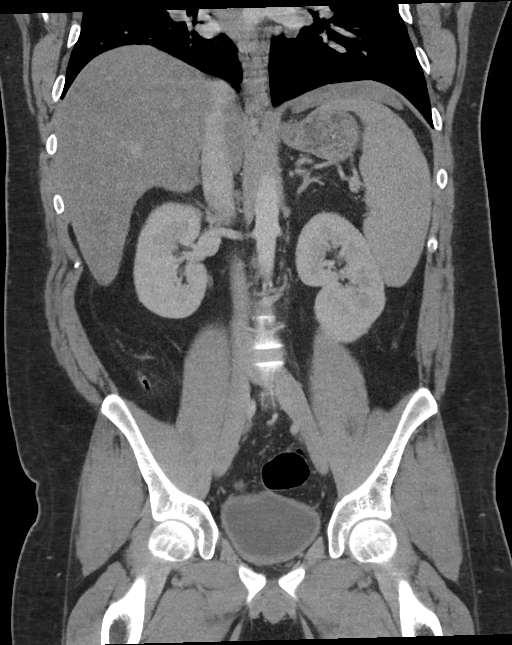

[15 of 46 positions shown; findings below may reference images not displayed]

RADIATION DOSE REDUCTION: This exam was performed according to the
departmental dose-optimization program which includes automated
exposure control, adjustment of the mA and/or kV according to
patient size and/or use of iterative reconstruction technique.

CONTRAST:  100mL OMNIPAQUE IOHEXOL 300 MG/ML  SOLN
FINDINGS: Lower chest: Query partially visualized right hilar soft tissue
density ([DATE]).

Hepatobiliary: Nodular hepatic contour. The liver is enlarged
measuring up to 22 cm. Diffusely heterogeneous hepatic parenchyma
with query underlying innumerable subcentimeter hypodensities. No
focal liver abnormality. No gallstones, gallbladder wall thickening,
or pericholecystic fluid. No biliary dilatation.

Pancreas: No focal lesion. Normal pancreatic contour. No surrounding
inflammatory changes. No main pancreatic ductal dilatation.

Spleen: The spleen is enlarged measuring up to 15 cm.

Adrenals/Urinary Tract:

No adrenal nodule bilaterally.

Bilateral kidneys enhance symmetrically.

No hydronephrosis. No hydroureter.

The urinary bladder is unremarkable.

Stomach/Bowel: Stomach is within normal limits. No evidence of bowel
wall thickening or dilatation. Appendix appears normal.

Vascular/Lymphatic: No abdominal aorta or iliac aneurysm. Several
prominent but nonenlarged retroperitoneal lymph nodes. No abdominal,
pelvic, or inguinal lymphadenopathy.

Reproductive: Prostate is unremarkable.

Other: No intraperitoneal free fluid. No intraperitoneal free gas.
No organized fluid collection.

Musculoskeletal:

No abdominal wall hernia or abnormality.

No suspicious lytic or blastic osseous lesions. No acute displaced
fracture.
IMPRESSION: 1. Cirrhosis with portal hypertension. Underlying innumerable
hepatic subcentimeter hypodensities. Recommend MRI liver protocol
for further evaluation.
2. Hepatosplenomegaly.
3. Query partially visualized right hilar soft tissue density.
Consider CT chest with intravenous contrast for further evaluation.

## 2024-04-13 IMAGING — US US ABDOMEN LIMITED
1 series · 14 of 25 positions shown · non-contrast
Comparison: None Available.

CLINICAL DATA: Right upper quadrant pain

EXAM:
ULTRASOUND ABDOMEN LIMITED RIGHT UPPER QUADRANT

[Series 1: us abdomen limited ruq (liver/gb) · 14 of 62 slices shown]
[im 1/62]
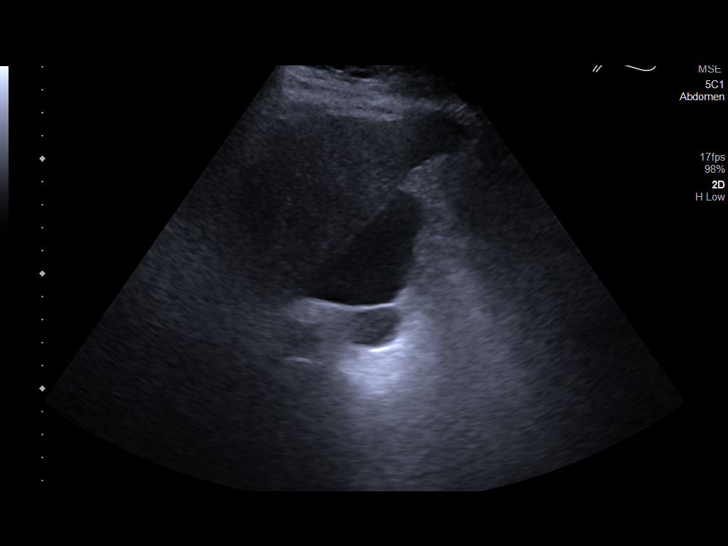
[im 6/62]
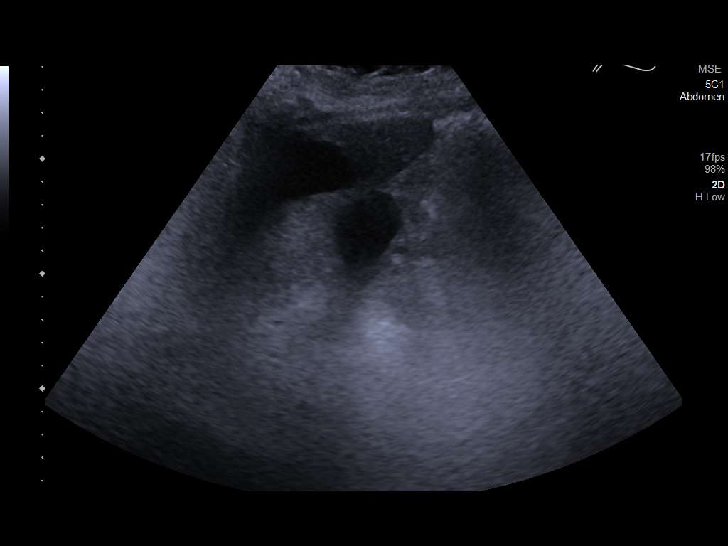
[im 11/62]
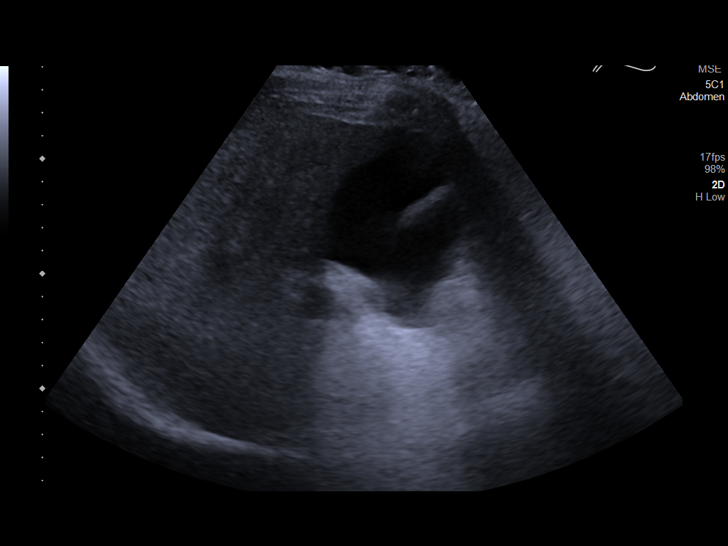
[im 16/62]
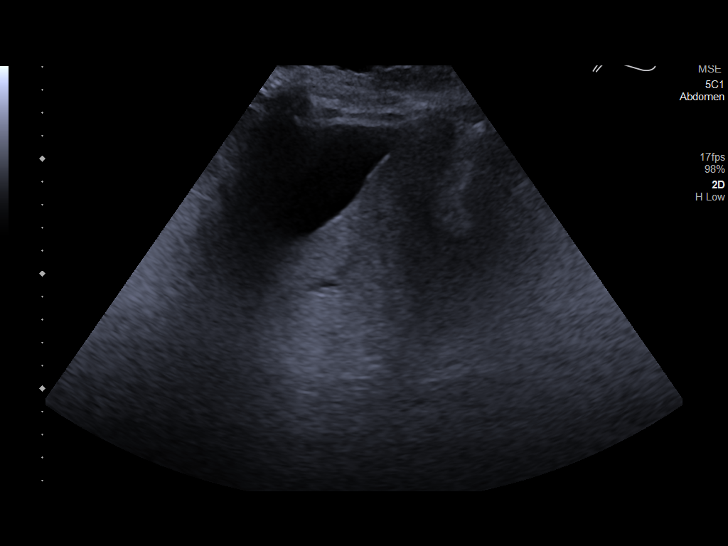
[im 21/62]
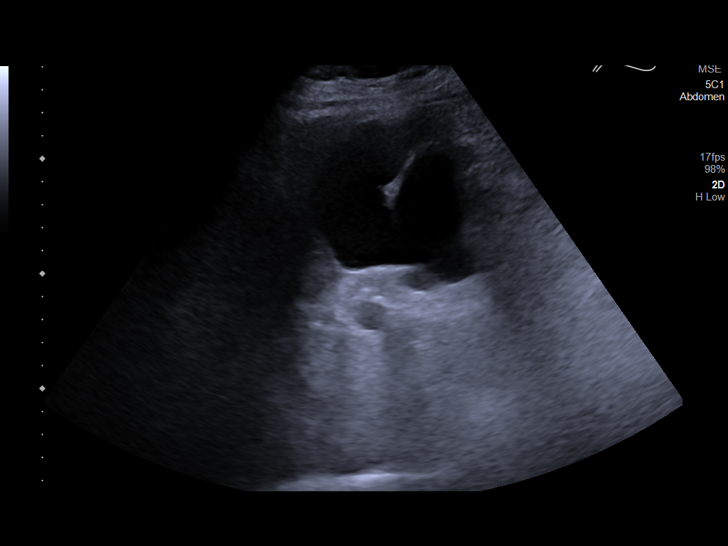
[im 23/62]
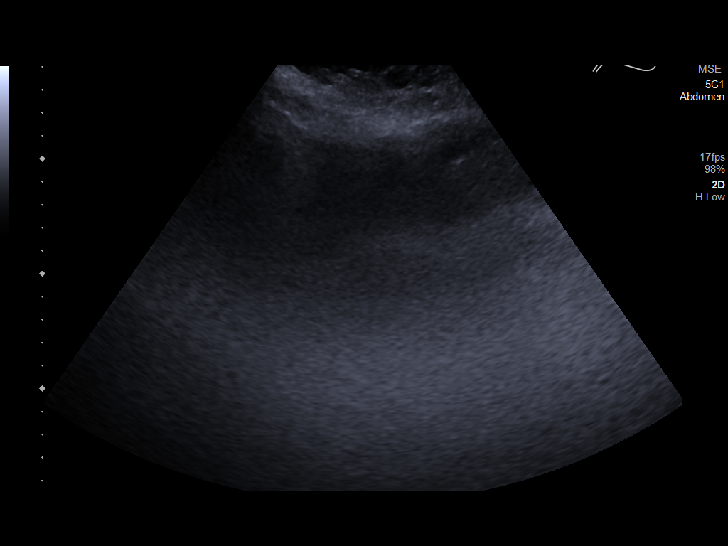
[im 28/62]
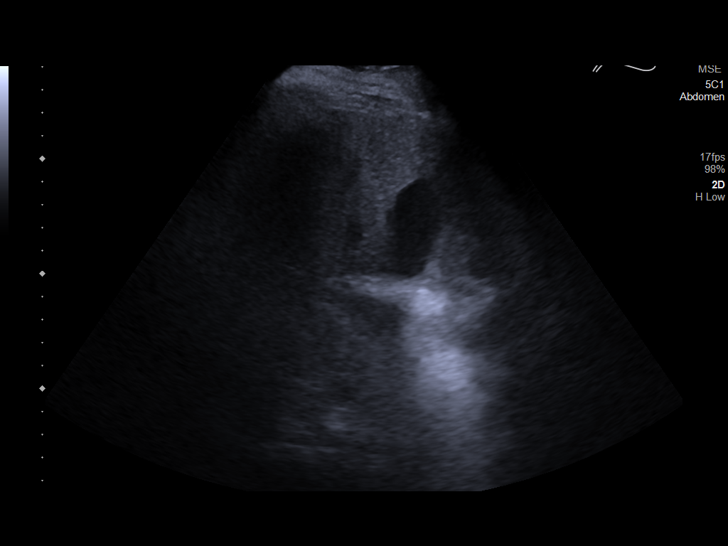
[im 34/62]
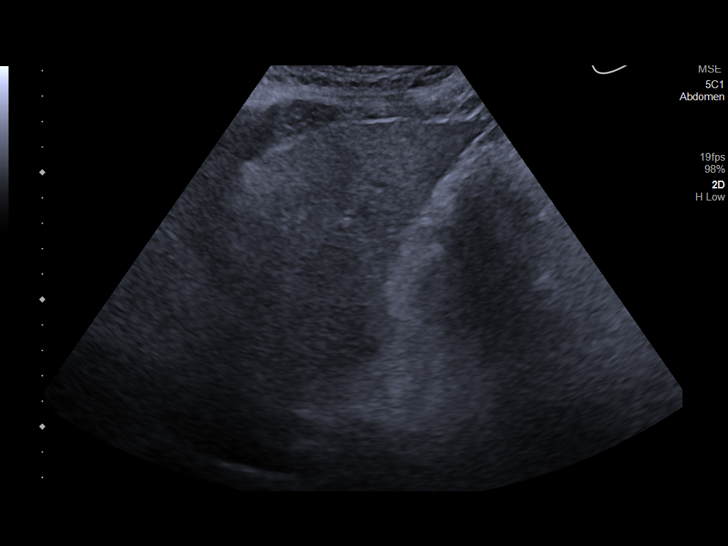
[im 39/62]
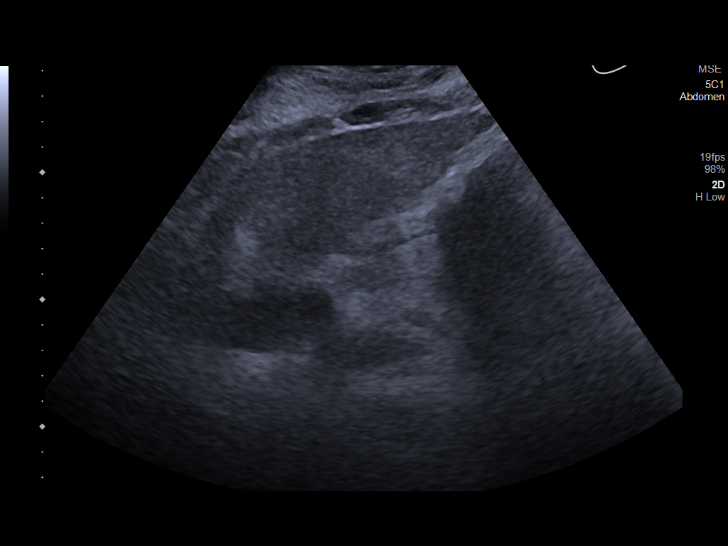
[im 41/62]
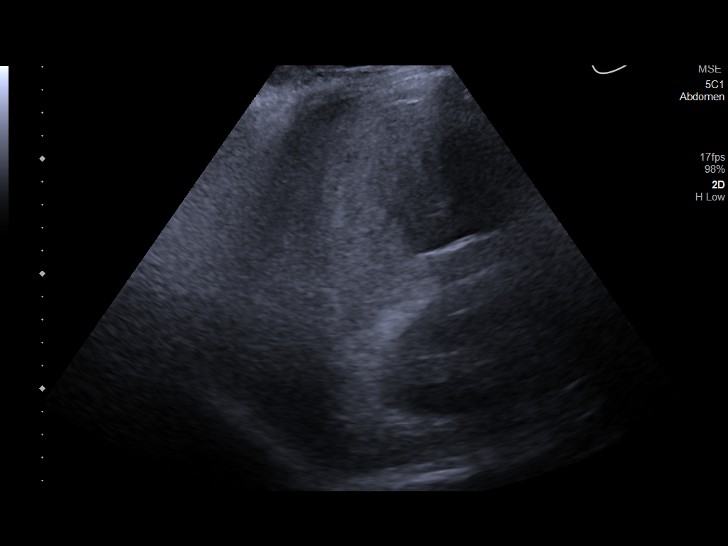
[im 46/62]
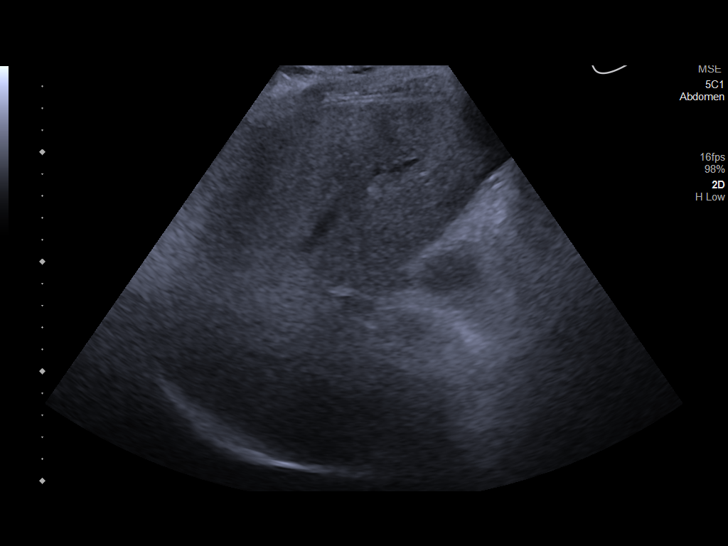
[im 51/62]
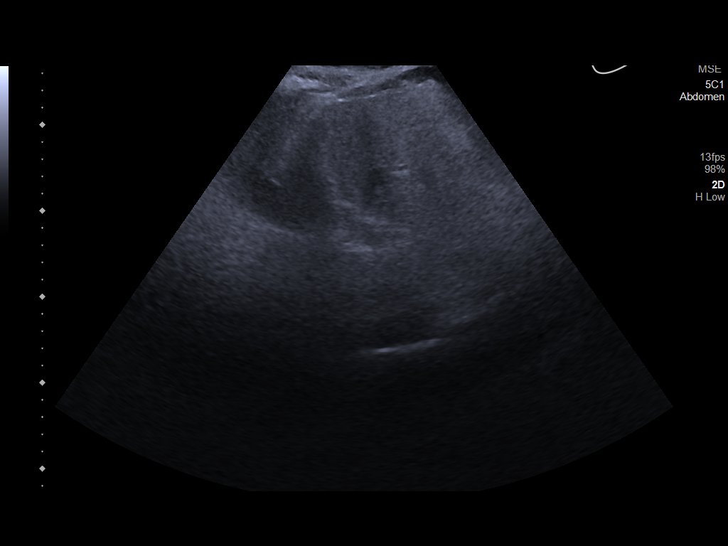
[im 56/62]
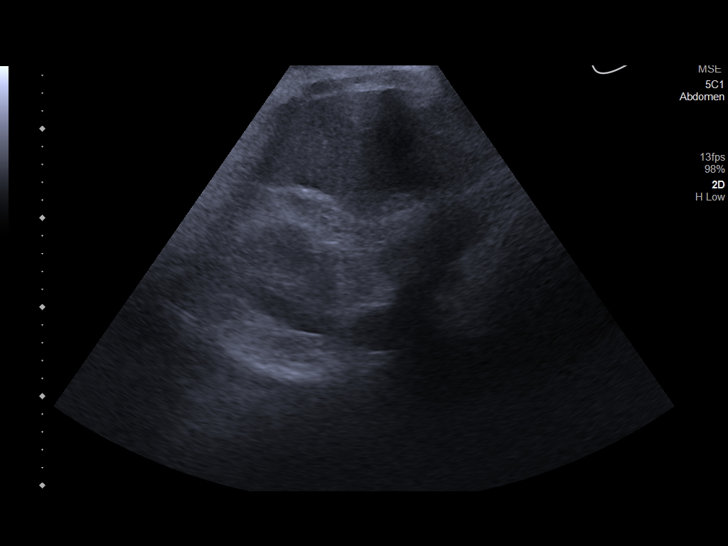
[im 62/62]
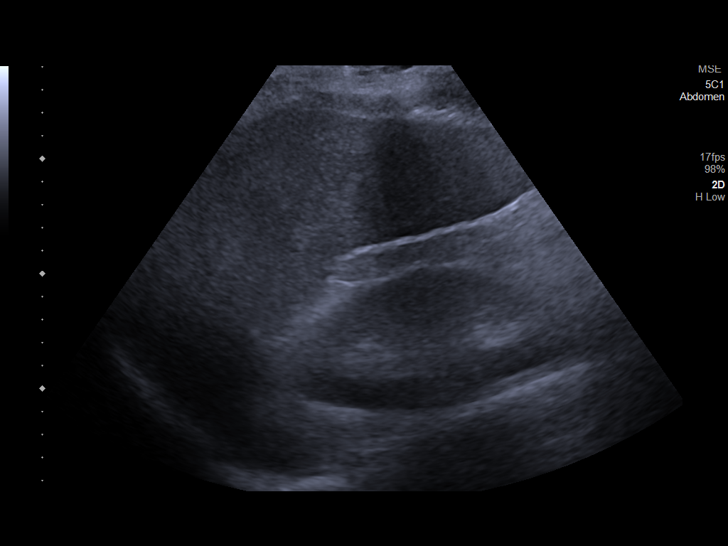

[14 of 25 positions shown; findings below may reference images not displayed]

FINDINGS: Gallbladder:

No gallstones or wall thickening visualized. No sonographic Murphy
sign noted by sonographer.

Common bile duct:

Diameter: Poorly visualized.  Normal where visualized at 3 mm.

Liver:

Heterogeneously increased hepatic echogenicity. Portal vein is
patent on color Doppler imaging with normal direction of blood flow
towards the liver.

Other: Limited exam secondary to patient body habitus.
IMPRESSION: Decreased sensitivity and specificity exam due to technique related
factors, as described above.

Given this limitation, no explanation for right upper quadrant pain.

Increased hepatic echogenicity, favoring steatosis.
